# Patient Record
Sex: Male | Born: 1945
Health system: Southern US, Community
[De-identification: ages and names within clinical notes are randomized; demographics above are authoritative.]

## PROBLEM LIST (undated history)

## (undated) DIAGNOSIS — I10 Essential (primary) hypertension: Secondary | ICD-10-CM

## (undated) DIAGNOSIS — Z9109 Other allergy status, other than to drugs and biological substances: Secondary | ICD-10-CM

## (undated) DIAGNOSIS — E785 Hyperlipidemia, unspecified: Secondary | ICD-10-CM

## (undated) HISTORY — DX: Other allergy status, other than to drugs and biological substances: Z91.09

## (undated) HISTORY — DX: Hyperlipidemia, unspecified: E78.5

## (undated) HISTORY — DX: Essential (primary) hypertension: I10

---

## 1998-08-08 HISTORY — PX: APPENDECTOMY: SHX54

## 2011-08-20 ENCOUNTER — Encounter: Payer: Self-pay | Admitting: Internal Medicine

## 2011-08-20 DIAGNOSIS — Z0000 Encounter for general adult medical examination without abnormal findings: Secondary | ICD-10-CM | POA: Insufficient documentation

## 2011-08-20 DIAGNOSIS — Z0001 Encounter for general adult medical examination with abnormal findings: Secondary | ICD-10-CM | POA: Insufficient documentation

## 2011-08-26 ENCOUNTER — Ambulatory Visit (INDEPENDENT_AMBULATORY_CARE_PROVIDER_SITE_OTHER)
Admission: RE | Admit: 2011-08-26 | Discharge: 2011-08-26 | Disposition: A | Discharge status: Home / Self Care | Payer: BC Managed Care – PPO | Source: Ambulatory Visit | Attending: Internal Medicine | Admitting: Internal Medicine

## 2011-08-26 ENCOUNTER — Ambulatory Visit (INDEPENDENT_AMBULATORY_CARE_PROVIDER_SITE_OTHER): Payer: BC Managed Care – PPO | Admitting: Internal Medicine

## 2011-08-26 ENCOUNTER — Other Ambulatory Visit (INDEPENDENT_AMBULATORY_CARE_PROVIDER_SITE_OTHER): Payer: BC Managed Care – PPO

## 2011-08-26 ENCOUNTER — Encounter: Payer: Self-pay | Admitting: Internal Medicine

## 2011-08-26 VITALS — BP 138/92 | HR 69 | Temp 97.5°F | Ht 60.0 in | Wt 113.0 lb

## 2011-08-26 DIAGNOSIS — R0689 Other abnormalities of breathing: Secondary | ICD-10-CM | POA: Insufficient documentation

## 2011-08-26 DIAGNOSIS — R0989 Other specified symptoms and signs involving the circulatory and respiratory systems: Secondary | ICD-10-CM

## 2011-08-26 DIAGNOSIS — Z0000 Encounter for general adult medical examination without abnormal findings: Secondary | ICD-10-CM

## 2011-08-26 DIAGNOSIS — I10 Essential (primary) hypertension: Secondary | ICD-10-CM | POA: Insufficient documentation

## 2011-08-26 DIAGNOSIS — E785 Hyperlipidemia, unspecified: Secondary | ICD-10-CM | POA: Insufficient documentation

## 2011-08-26 DIAGNOSIS — J029 Acute pharyngitis, unspecified: Secondary | ICD-10-CM | POA: Insufficient documentation

## 2011-08-26 LAB — CBC WITH DIFFERENTIAL/PLATELET
Basophils Absolute: 0.1 10*3/uL (ref 0.0–0.1)
Eosinophils Absolute: 0.3 10*3/uL (ref 0.0–0.7)
HCT: 43.3 % (ref 39.0–52.0)
Lymphs Abs: 2.4 10*3/uL (ref 0.7–4.0)
MCHC: 33.6 g/dL (ref 30.0–36.0)
MCV: 94.1 fl (ref 78.0–100.0)
Monocytes Absolute: 0.5 10*3/uL (ref 0.1–1.0)
Platelets: 223 10*3/uL (ref 150.0–400.0)
RDW: 12.8 % (ref 11.5–14.6)

## 2011-08-26 LAB — HEPATIC FUNCTION PANEL
AST: 21 U/L (ref 0–37)
Alkaline Phosphatase: 39 U/L (ref 39–117)
Total Bilirubin: 0.4 mg/dL (ref 0.3–1.2)

## 2011-08-26 LAB — LIPID PANEL
HDL: 55.2 mg/dL (ref >39.00)
VLDL: 21.4 mg/dL (ref 0.0–40.0)

## 2011-08-26 LAB — BASIC METABOLIC PANEL
Chloride: 96 mEq/L (ref 96–112)
GFR: 95.36 mL/min (ref >60.00)
Glucose, Bld: 83 mg/dL (ref 70–99)
Potassium: 3.7 mEq/L (ref 3.5–5.1)
Sodium: 135 mEq/L (ref 135–145)

## 2011-08-26 LAB — URINALYSIS, ROUTINE W REFLEX MICROSCOPIC
Hgb urine dipstick: NEGATIVE
Ketones, ur: NEGATIVE
Total Protein, Urine: NEGATIVE
Urine Glucose: NEGATIVE
Urobilinogen, UA: 0.2 (ref 0.0–1.0)

## 2011-08-26 LAB — TSH: TSH: 1.24 u[IU]/mL (ref 0.35–5.50)

## 2011-08-26 MED ORDER — AZITHROMYCIN 250 MG PO TABS: ORAL_TABLET | ORAL | Status: AC

## 2011-08-26 MED ORDER — LISINOPRIL 10 MG PO TABS: 10.0000 mg | ORAL_TABLET | Freq: Every day | ORAL | Status: DC

## 2011-08-26 MED ORDER — AMLODIPINE BESYLATE 5 MG PO TABS: 5.0000 mg | ORAL_TABLET | Freq: Every day | ORAL | Status: DC

## 2011-08-26 NOTE — Assessment & Plan Note (Signed)
mod, for antibx course,  to f/u any worsening symptoms or concerns 

## 2011-08-26 NOTE — Assessment & Plan Note (Addendum)
Mild uncontrolled, also with constipation on the verapamil;  To d/c verapamil, cont the ACE, and start amlodipine 5 qd, f/u BP at home and next visit

## 2011-08-26 NOTE — Patient Instructions (Addendum)
Take all new medications as prescribed - the antibiotic (azithromycin), and the new blood pressure medicine (amlodipine) Continue all other medications as before - the lisinopril, and the Aspirin 81 mg OK to stop the verapamil You can also take Delsym OTC for cough, and/or Mucinex (or it's generic off brand) for congestion All of your medication were sent to the pharmacy by the computer today Your EKG was good today Please go to XRAY in the Basement for the x-ray test Please go to LAB in the Basement for the blood and/or urine tests to be done today Please call if you change your mind about having the colonoscopy (colon test) Please return in 3 months, or sooner if needed; if the Blood Pressure is ok at that time, we can see you back yearly

## 2011-08-26 NOTE — Assessment & Plan Note (Signed)
Finding of dry rales I think, asympt, nonsmoker  - for cxr to r/o other

## 2011-08-26 NOTE — Assessment & Plan Note (Addendum)
Overall doing well, age appropriate education and counseling updated, referrals for preventative services and immunizations addressed, dietary and smoking counseling addressed, most recent labs and ECG reviewed.  I have personally reviewed and have noted: 1) the patient's medical and social history 2) The pt's use of alcohol, tobacco, and illicit drugs 3) The patient's current medications and supplements 4) Functional ability including ADL's, fall risk, home safety risk, hearing and visual impairment 5) Diet and physical activities 6) Evidence for depression or mood disorder 7) The patient's height, weight, and BMI have been recorded in the chart I have made referrals, and provided counseling and education based on review of the above ECG reviewed as per emr, declines colonoscopy

## 2011-08-26 NOTE — Progress Notes (Signed)
Subjective:    Patient ID: Douglas Miles, male    DOB: 1946-01-29, 66 y.o.   MRN: [id number]  HPI  Here for wellness and f/u;  Overall doing ok;  Pt denies CP, worsening SOB, DOE, wheezing, orthopnea, PND, worsening LE edema, palpitations, dizziness or syncope.  Pt denies neurological change such as new Headache, facial or extremity weakness.  Pt denies polydipsia, polyuria, or low sugar symptoms. Pt states overall good compliance with treatment and medications, good tolerability, and trying to follow lower cholesterol diet.  Pt denies worsening depressive symptoms, suicidal ideation or panic. No fever, wt loss, night sweats, loss of appetite, or other constitutional symptoms.  Pt states good ability with ADL's, low fall risk, home safety reviewed and adequate, no significant changes in hearing or vision, and occasionally active with exercise.  Unfortunately with re-start of verapamil recently per UC has had constipation., and HCTZ stopped due to low K. Also incidentally with severe ST for 2-3 days with low grade temp, general weakness and malaise Past Medical History  Diagnosis Date  . Hypertension   . Hyperlipidemia    Past Surgical History  Procedure Date  . Appendectomy 2000    reports that he has quit smoking. He does not have any smokeless tobacco history on file. He reports that he drinks alcohol. He reports that he does not use illicit drugs. family history includes Hyperlipidemia in his son and Hypertension in his other. No Known Allergies No current outpatient prescriptions on file prior to visit.   Review of Systems Review of Systems  Constitutional: Negative for diaphoresis, activity change, appetite change and unexpected weight change.  HENT: Negative for hearing loss, ear pain, facial swelling, mouth sores and neck stiffness.   Eyes: Negative for pain, redness and visual disturbance.  Respiratory: Negative for shortness of breath and wheezing.   Cardiovascular: Negative for chest  pain and palpitations.  Gastrointestinal: Negative for diarrhea, blood in stool, abdominal distention and rectal pain.  Genitourinary: Negative for hematuria, flank pain and decreased urine volume.  Musculoskeletal: Negative for myalgias and joint swelling.  Skin: Negative for color change and wound.  Neurological: Negative for syncope and numbness.  Hematological: Negative for adenopathy.  Psychiatric/Behavioral: Negative for hallucinations, self-injury, decreased concentration and agitation.      Objective:   Physical Exam BP 138/92  Pulse 69  Temp(Src) 97.5 F (36.4 C) (Oral)  Ht 5' (1.524 m)  Wt 113 lb (51.256 kg)  BMI 22.07 kg/m2  SpO2 95% Physical Exam  VS noted, mild ill Constitutional: Pt is oriented to person, place, and time. Appears well-developed and well-nourished.  Head: Normocephalic and atraumatic.  Right Ear: External ear normal.  Left Ear: External ear normal.  Bilat tm's mild erythema.  Sinus nontender.  Pharynx severe erythema, slight exudate Nose: Nose normal.  Mouth/Throat: Oropharynx is clear and moist.  Eyes: Conjunctivae and EOM are normal. Pupils are equal, round, and reactive to light.  Neck: Normal range of motion. Neck supple. No JVD present. No tracheal deviation present.  Cardiovascular: Normal rate, regular rhythm, normal heart sounds and intact distal pulses.   Pulmonary/Chest: Effort normal and breath sounds normal. except for dry rales bibasilar Abdominal: Soft. Bowel sounds are normal. There is no tenderness.  Musculoskeletal: Normal range of motion. Exhibits no edema.  Lymphadenopathy:  Has no cervical adenopathy.  Neurological: Pt is alert and oriented to person, place, and time. Pt has normal reflexes. No cranial nerve deficit.  Skin: Skin is warm and dry. No rash noted.  Psychiatric:  Has  normal mood and affect. Behavior is normal.     Assessment & Plan:

## 2011-11-25 ENCOUNTER — Encounter: Payer: Self-pay | Admitting: Internal Medicine

## 2011-11-25 ENCOUNTER — Ambulatory Visit (INDEPENDENT_AMBULATORY_CARE_PROVIDER_SITE_OTHER): Payer: BC Managed Care – PPO | Admitting: Internal Medicine

## 2011-11-25 VITALS — BP 140/90 | HR 81 | Temp 97.5°F | Ht 60.0 in | Wt 112.1 lb

## 2011-11-25 DIAGNOSIS — R0689 Other abnormalities of breathing: Secondary | ICD-10-CM

## 2011-11-25 DIAGNOSIS — Z0000 Encounter for general adult medical examination without abnormal findings: Secondary | ICD-10-CM

## 2011-11-25 DIAGNOSIS — I10 Essential (primary) hypertension: Secondary | ICD-10-CM

## 2011-11-25 DIAGNOSIS — R0989 Other specified symptoms and signs involving the circulatory and respiratory systems: Secondary | ICD-10-CM

## 2011-11-25 DIAGNOSIS — E785 Hyperlipidemia, unspecified: Secondary | ICD-10-CM

## 2011-11-25 MED ORDER — ATORVASTATIN CALCIUM 10 MG PO TABS: 10.0000 mg | ORAL_TABLET | Freq: Every day | ORAL | Status: DC

## 2011-11-25 MED ORDER — LISINOPRIL 20 MG PO TABS: 20.0000 mg | ORAL_TABLET | Freq: Every day | ORAL | Status: DC

## 2011-11-25 NOTE — Patient Instructions (Signed)
Please increase the lisinopril to 20 mg per day Please start the generic lipitor at 10 mg per day Continue all other medications as before Please have the pharmacy call with any refills you may need. Please return in 9 mo with Lab testing done 3-5 days before

## 2011-11-26 ENCOUNTER — Encounter: Payer: Self-pay | Admitting: Internal Medicine

## 2011-11-26 NOTE — Progress Notes (Signed)
  Subjective:    Patient ID: Douglas Miles, male    DOB: 22-May-1946, 66 y.o.   MRN: [id number]  HPI  Here to f/u; overall doing ok,  Pt denies chest pain, increased sob or doe, wheezing, orthopnea, PND, increased LE swelling, palpitations, dizziness or syncope.  Pt denies new neurological symptoms such as new headache, or facial or extremity weakness or numbness   Pt denies polydipsia, polyuria,   Pt states overall good compliance with meds, trying to follow lower cholesterol diet, wt overall stable.   Pt denies fever, wt loss, night sweats, loss of appetite, or other constitutional symptoms.  Son with him interpreting today.  No acute complaints Past Medical History  Diagnosis Date  . Hypertension   . Hyperlipidemia    Past Surgical History  Procedure Date  . Appendectomy 2000    reports that he has quit smoking. He does not have any smokeless tobacco history on file. He reports that he drinks alcohol. He reports that he does not use illicit drugs. family history includes Hyperlipidemia in his son and Hypertension in his other. No Known Allergies Current Outpatient Prescriptions on File Prior to Visit  Medication Sig Dispense Refill  . amLODipine (NORVASC) 5 MG tablet Take 1 tablet (5 mg total) by mouth daily.  90 tablet  3  . aspirin 81 MG tablet Take 160 mg by mouth daily.      . Fiber CHEW Chew by mouth daily.      . Multiple Vitamins-Minerals (MULTIVITAMIN PO) Take by mouth daily.      Marland Kitchen atorvastatin (LIPITOR) 10 MG tablet Take 1 tablet (10 mg total) by mouth daily.  90 tablet  3   Review of Systems Review of Systems  Eyes: Negative for photophobia and visual disturbance.  Respiratory: Negative for choking and stridor.   Gastrointestinal: Negative for vomiting and blood in stool.  Genitourinary: Negative for hematuria and decreased urine volume.  Musculoskeletal: Negative for gait problem.  Skin: Negative for color change and wound.  Neurological: Negative for tremors and numbness.    Psychiatric/Behavioral: Negative for decreased concentration. The patient is not hyperactive.      Objective:   Physical Exam BP 140/90  Pulse 81  Temp(Src) 97.5 F (36.4 C) (Oral)  Ht 5' (1.524 m)  Wt 112 lb 1.9 oz (50.857 kg)  BMI 21.90 kg/m2  SpO2 99% Physical Exam  VS noted, not ill appearing Constitutional: Pt appears well-developed and well-nourished.  HENT: Head: Normocephalic.  Right Ear: External ear normal.  Left Ear: External ear normal.  Eyes: Conjunctivae and EOM are normal. Pupils are equal, round, and reactive to light.  Neck: Normal range of motion. Neck supple.  Cardiovascular: Normal rate and regular rhythm.   Pulmonary/Chest: Effort normal and breath sounds normal.  Neurological: Pt is alert. Motor/gait normal Skin: Skin is warm. No erythema.  Psychiatric: Pt behavior is normal. Thought content normal.     Assessment & Plan:

## 2011-11-26 NOTE — Assessment & Plan Note (Signed)
Mild uncontrolled, to start lipitor 10 qd, cont low chol diet,  to f/u any worsening symptoms or concerns

## 2011-11-26 NOTE — Assessment & Plan Note (Signed)
BP Readings from Last 3 Encounters:  11/25/11 140/90  08/26/11 138/92   stable overall by hx and exam, most recent data reviewed with pt, and pt to continue medical treatment as before except to increse the lisinopril to 20 mg daily

## 2011-11-26 NOTE — Assessment & Plan Note (Signed)
Exam cleared today, recent cxr neg and shared with pt;   to f/u any worsening symptoms or concerns

## 2012-08-28 ENCOUNTER — Other Ambulatory Visit (INDEPENDENT_AMBULATORY_CARE_PROVIDER_SITE_OTHER): Payer: BC Managed Care – PPO

## 2012-08-28 DIAGNOSIS — Z0000 Encounter for general adult medical examination without abnormal findings: Secondary | ICD-10-CM

## 2012-08-28 LAB — URINALYSIS, ROUTINE W REFLEX MICROSCOPIC
Ketones, ur: NEGATIVE
Leukocytes, UA: NEGATIVE
Nitrite: NEGATIVE
Specific Gravity, Urine: <=1.005 (ref 1.000–1.030)
pH: 6.5 (ref 5.0–8.0)

## 2012-08-28 LAB — HEPATIC FUNCTION PANEL
ALT: 18 U/L (ref 0–53)
AST: 21 U/L (ref 0–37)
Alkaline Phosphatase: 33 U/L — ABNORMAL LOW (ref 39–117)
Bilirubin, Direct: 0.1 mg/dL (ref 0.0–0.3)
Total Protein: 7.4 g/dL (ref 6.0–8.3)

## 2012-08-28 LAB — CBC WITH DIFFERENTIAL/PLATELET
Basophils Relative: 0.5 % (ref 0.0–3.0)
Eosinophils Relative: 1.5 % (ref 0.0–5.0)
Lymphocytes Relative: 23.8 % (ref 12.0–46.0)
MCV: 94.9 fl (ref 78.0–100.0)
Monocytes Relative: 6.7 % (ref 3.0–12.0)
Neutrophils Relative %: 67.5 % (ref 43.0–77.0)
RBC: 4.27 Mil/uL (ref 4.22–5.81)
WBC: 9.3 10*3/uL (ref 4.5–10.5)

## 2012-08-28 LAB — BASIC METABOLIC PANEL
BUN: 15 mg/dL (ref 6–23)
Creatinine, Ser: 0.9 mg/dL (ref 0.4–1.5)
GFR: 93.8 mL/min (ref >60.00)
Glucose, Bld: 92 mg/dL (ref 70–99)

## 2012-08-28 LAB — PSA: PSA: 1.36 ng/mL (ref 0.10–4.00)

## 2012-08-28 LAB — TSH: TSH: 1.02 u[IU]/mL (ref 0.35–5.50)

## 2012-08-28 LAB — LIPID PANEL
Cholesterol: 121 mg/dL (ref 0–200)
LDL Cholesterol: 58 mg/dL (ref 0–99)

## 2012-08-31 ENCOUNTER — Encounter: Payer: Self-pay | Admitting: Internal Medicine

## 2012-08-31 ENCOUNTER — Ambulatory Visit (INDEPENDENT_AMBULATORY_CARE_PROVIDER_SITE_OTHER): Payer: BC Managed Care – PPO | Admitting: Internal Medicine

## 2012-08-31 VITALS — BP 142/80 | HR 75 | Temp 98.1°F | Ht 60.0 in | Wt 114.2 lb

## 2012-08-31 DIAGNOSIS — I10 Essential (primary) hypertension: Secondary | ICD-10-CM

## 2012-08-31 DIAGNOSIS — Z0000 Encounter for general adult medical examination without abnormal findings: Secondary | ICD-10-CM

## 2012-08-31 DIAGNOSIS — E785 Hyperlipidemia, unspecified: Secondary | ICD-10-CM

## 2012-08-31 MED ORDER — ATORVASTATIN CALCIUM 10 MG PO TABS: 10.0000 mg | ORAL_TABLET | Freq: Every day | ORAL | Status: DC

## 2012-08-31 MED ORDER — LISINOPRIL 20 MG PO TABS: 20.0000 mg | ORAL_TABLET | Freq: Every day | ORAL | Status: DC

## 2012-08-31 NOTE — Patient Instructions (Addendum)
Please continue all other medications as before, and refills have been done if requested. Your refills were done today Please call if you change your mind about the colonoscopy. Please continue your efforts at being more active, low cholesterol diet, and weight control. You are otherwise up to date with prevention measures today. Please return in 1 year for your yearly visit, or sooner if needed, with Lab testing done 3-5 days before

## 2012-08-31 NOTE — Assessment & Plan Note (Signed)

## 2012-09-01 ENCOUNTER — Encounter: Payer: Self-pay | Admitting: Internal Medicine

## 2012-09-01 ENCOUNTER — Other Ambulatory Visit: Payer: Self-pay | Admitting: Internal Medicine

## 2012-09-01 NOTE — Assessment & Plan Note (Signed)
.  stable overall by history and exam, recent data reviewed with pt, and pt to continue medical treatment as before,  to f/u any worsening symptoms or concerns BP Readings from Last 3 Encounters:  08/31/12 142/80  11/25/11 140/90  08/26/11 138/92

## 2012-09-01 NOTE — Progress Notes (Signed)
Subjective:    Patient ID: Douglas Miles, male    DOB: 05/17/1946, 67 y.o.   MRN: [id number]  HPI  67 yo [place] male, very nice, speaks some english but here with daughter (appaers in her 69's) who translates excellently. Here for wellness and f/u;  Overall doing ok;  Pt denies CP, worsening SOB, DOE, wheezing, orthopnea, PND, worsening LE edema, palpitations, dizziness or syncope.  Pt denies neurological change such as new headache, facial or extremity weakness.  Pt denies polydipsia, polyuria, or low sugar symptoms. Pt states overall good compliance with treatment and medications, good tolerability, and has been trying to follow lower cholesterol diet.  Pt denies worsening depressive symptoms, suicidal ideation or panic. No fever, night sweats, wt loss, loss of appetite, or other constitutional symptoms.  Pt states good ability with ADL's, has low fall risk, home safety reviewed and adequate, no other significant changes in hearing or vision, and only occasionally active with exercise.  Tolerting statin well.  BP at home < 140/90 Past Medical History  Diagnosis Date  . Hypertension   . Hyperlipidemia    Past Surgical History  Procedure Date  . Appendectomy 2000    reports that he has quit smoking. He does not have any smokeless tobacco history on file. He reports that he drinks alcohol. He reports that he does not use illicit drugs. family history includes Hyperlipidemia in his son and Hypertension in his other. No Known Allergies Current Outpatient Prescriptions on File Prior to Visit  Medication Sig Dispense Refill  . aspirin 81 MG tablet Take 160 mg by mouth daily.      Marland Kitchen atorvastatin (LIPITOR) 10 MG tablet Take 1 tablet (10 mg total) by mouth daily.  90 tablet  3  . Fiber CHEW Chew by mouth daily.      . Multiple Vitamins-Minerals (MULTIVITAMIN PO) Take by mouth daily.      Marland Kitchen amLODipine (NORVASC) 5 MG tablet Take 1 tablet (5 mg total) by mouth daily.  90 tablet  3   Review of  Systems Constitutional: Negative for diaphoresis, activity change, appetite change or unexpected weight change.  HENT: Negative for hearing loss, ear pain, facial swelling, mouth sores and neck stiffness.   Eyes: Negative for pain, redness and visual disturbance.  Respiratory: Negative for shortness of breath and wheezing.   Cardiovascular: Negative for chest pain and palpitations.  Gastrointestinal: Negative for diarrhea, blood in stool, abdominal distention or other pain Genitourinary: Negative for hematuria, flank pain or change in urine volume.  Musculoskeletal: Negative for myalgias and joint swelling.  Skin: Negative for color change and wound.  Neurological: Negative for syncope and numbness. other than noted Hematological: Negative for adenopathy.  Psychiatric/Behavioral: Negative for hallucinations, self-injury, decreased concentration and agitation.      Objective:   Physical Exam BP 142/80  Pulse 75  Temp 98.1 F (36.7 C) (Oral)  Ht 5' (1.524 m)  Wt 114 lb 4 oz (51.823 kg)  BMI 22.31 kg/m2  SpO2 98% VS noted,  Constitutional: Pt is oriented to person, place, and time. Appears well-developed and well-nourished.  Head: Normocephalic and atraumatic.  Right Ear: External ear normal.  Left Ear: External ear normal.  Nose: Nose normal.  Mouth/Throat: Oropharynx is clear and moist.  Eyes: Conjunctivae and EOM are normal. Pupils are equal, round, and reactive to light.  Neck: Normal range of motion. Neck supple. No JVD present. No tracheal deviation present.  Cardiovascular: Normal rate, regular rhythm, normal heart sounds and intact distal pulses.  Pulmonary/Chest: Effort normal and breath sounds normal.  Abdominal: Soft. Bowel sounds are normal. There is no tenderness. No HSM  Musculoskeletal: Normal range of motion. Exhibits no edema.  Lymphadenopathy:  Has no cervical adenopathy.  Neurological: Pt is alert and oriented to person, place, and time. Pt has normal reflexes.  No cranial nerve deficit.  Skin: Skin is warm and dry. No rash noted.  Psychiatric:  Has  normal mood and affect. Behavior is normal.     Assessment & Plan:

## 2012-09-01 NOTE — Assessment & Plan Note (Signed)
Improved, Please continue all other medications as before

## 2013-08-30 ENCOUNTER — Other Ambulatory Visit (INDEPENDENT_AMBULATORY_CARE_PROVIDER_SITE_OTHER): Payer: BC Managed Care – PPO

## 2013-08-30 DIAGNOSIS — Z0000 Encounter for general adult medical examination without abnormal findings: Secondary | ICD-10-CM

## 2013-08-30 LAB — CBC WITH DIFFERENTIAL/PLATELET
BASOS ABS: 0 10*3/uL (ref 0.0–0.1)
Basophils Relative: 0.3 % (ref 0.0–3.0)
Eosinophils Absolute: 0.2 10*3/uL (ref 0.0–0.7)
Eosinophils Relative: 2.7 % (ref 0.0–5.0)
HEMATOCRIT: 42 % (ref 39.0–52.0)
Hemoglobin: 14.1 g/dL (ref 13.0–17.0)
LYMPHS ABS: 2.5 10*3/uL (ref 0.7–4.0)
Lymphocytes Relative: 36.6 % (ref 12.0–46.0)
MCHC: 33.5 g/dL (ref 30.0–36.0)
MCV: 93.2 fl (ref 78.0–100.0)
MONOS PCT: 9.1 % (ref 3.0–12.0)
Monocytes Absolute: 0.6 10*3/uL (ref 0.1–1.0)
NEUTROS PCT: 51.3 % (ref 43.0–77.0)
Neutro Abs: 3.6 10*3/uL (ref 1.4–7.7)
PLATELETS: 209 10*3/uL (ref 150.0–400.0)
RBC: 4.51 Mil/uL (ref 4.22–5.81)
RDW: 12.9 % (ref 11.5–14.6)
WBC: 6.9 10*3/uL (ref 4.5–10.5)

## 2013-08-30 LAB — LIPID PANEL
CHOL/HDL RATIO: 2
Cholesterol: 120 mg/dL (ref 0–200)
HDL: 51 mg/dL (ref >39.00)
LDL Cholesterol: 36 mg/dL (ref 0–99)
Triglycerides: 163 mg/dL — ABNORMAL HIGH (ref 0.0–149.0)
VLDL: 32.6 mg/dL (ref 0.0–40.0)

## 2013-08-30 LAB — BASIC METABOLIC PANEL
BUN: 16 mg/dL (ref 6–23)
CALCIUM: 9.6 mg/dL (ref 8.4–10.5)
CO2: 28 mEq/L (ref 19–32)
Chloride: 100 mEq/L (ref 96–112)
Creatinine, Ser: 1 mg/dL (ref 0.4–1.5)
GFR: 82.38 mL/min (ref >60.00)
GLUCOSE: 80 mg/dL (ref 70–99)
POTASSIUM: 4.3 meq/L (ref 3.5–5.1)
SODIUM: 135 meq/L (ref 135–145)

## 2013-08-30 LAB — HEPATIC FUNCTION PANEL
ALT: 24 U/L (ref 0–53)
AST: 23 U/L (ref 0–37)
Albumin: 4.4 g/dL (ref 3.5–5.2)
Alkaline Phosphatase: 39 U/L (ref 39–117)
BILIRUBIN TOTAL: 0.6 mg/dL (ref 0.3–1.2)
Bilirubin, Direct: 0.1 mg/dL (ref 0.0–0.3)
Total Protein: 7.3 g/dL (ref 6.0–8.3)

## 2013-08-30 LAB — URINALYSIS, ROUTINE W REFLEX MICROSCOPIC
Bilirubin Urine: NEGATIVE
Hgb urine dipstick: NEGATIVE
Ketones, ur: NEGATIVE
Leukocytes, UA: NEGATIVE
NITRITE: NEGATIVE
Specific Gravity, Urine: 1.01 (ref 1.000–1.030)
Total Protein, Urine: NEGATIVE
UROBILINOGEN UA: 0.2 (ref 0.0–1.0)
Urine Glucose: NEGATIVE
pH: 6.5 (ref 5.0–8.0)

## 2013-08-30 LAB — PSA: PSA: 1.23 ng/mL (ref 0.10–4.00)

## 2013-08-30 LAB — TSH: TSH: 1.1 u[IU]/mL (ref 0.35–5.50)

## 2013-09-06 ENCOUNTER — Encounter: Payer: Self-pay | Admitting: Internal Medicine

## 2013-09-06 ENCOUNTER — Ambulatory Visit (INDEPENDENT_AMBULATORY_CARE_PROVIDER_SITE_OTHER): Payer: BC Managed Care – PPO | Admitting: Internal Medicine

## 2013-09-06 VITALS — BP 128/80 | HR 87 | Temp 98.3°F | Wt 114.2 lb

## 2013-09-06 DIAGNOSIS — E785 Hyperlipidemia, unspecified: Secondary | ICD-10-CM

## 2013-09-06 DIAGNOSIS — I10 Essential (primary) hypertension: Secondary | ICD-10-CM

## 2013-09-06 DIAGNOSIS — Z0000 Encounter for general adult medical examination without abnormal findings: Secondary | ICD-10-CM

## 2013-09-06 DIAGNOSIS — Z23 Encounter for immunization: Secondary | ICD-10-CM

## 2013-09-06 MED ORDER — ATORVASTATIN CALCIUM 10 MG PO TABS: 10.0000 mg | ORAL_TABLET | ORAL | Status: DC

## 2013-09-06 MED ORDER — AMLODIPINE BESYLATE 5 MG PO TABS: ORAL_TABLET | ORAL | Status: DC

## 2013-09-06 NOTE — Assessment & Plan Note (Signed)
stable overall by history and exam, recent data reviewed with pt, and pt to continue medical treatment as before,  to f/u any worsening symptoms or concerns BP Readings from Last 3 Encounters:  09/06/13 128/80  08/31/12 142/80  11/25/11 140/90

## 2013-09-06 NOTE — Assessment & Plan Note (Signed)
Overcontrolled, to take the lipitor 10 qod

## 2013-09-06 NOTE — Patient Instructions (Addendum)
You had the new Prevnar pneumonia shot today  OK to take the lipitor at 10 mg every Other day Please continue all other medications as before, and refills have been done if requested. Please continue your efforts at being more active, low cholesterol diet, and weight control. Please have the pharmacy call with any other refills you may need.  Please remember to sign up for My Chart if you have not done so, as this will be important to you in the future with finding out test results, communicating by private email, and scheduling acute appointments online when needed.  Please return in 1 year for your yearly visit, or sooner if needed, with Lab testing done 3-5 days before

## 2013-09-06 NOTE — Progress Notes (Signed)
Pre-visit discussion using our clinic review tool. No additional management support is needed unless otherwise documented below in the visit note.  

## 2013-09-06 NOTE — Assessment & Plan Note (Signed)

## 2013-09-06 NOTE — Progress Notes (Signed)
Subjective:    Patient ID: Douglas Miles, male    DOB: 12-Dec-1945, 68 y.o.   MRN: [id number]  HPI  Here for wellness and f/u with son as interpretor;  Overall doing ok;  Pt denies CP, worsening SOB, DOE, wheezing, orthopnea, PND, worsening LE edema, palpitations, dizziness or syncope.  Pt denies neurological change such as new headache, facial or extremity weakness.  Pt denies polydipsia, polyuria, or low sugar symptoms. Pt states overall good compliance with treatment and medications, good tolerability, and has been trying to follow lower cholesterol diet.  Pt denies worsening depressive symptoms, suicidal ideation or panic. No fever, night sweats, wt loss, loss of appetite, or other constitutional symptoms.  Pt states good ability with ADL's, has low fall risk, home safety reviewed and adequate, no other significant changes in hearing or vision, and only occasionally active with exercise. No acute complaints Past Medical History  Diagnosis Date  . Hypertension   . Hyperlipidemia    Past Surgical History  Procedure Laterality Date  . Appendectomy  2000    reports that he has quit smoking. He does not have any smokeless tobacco history on file. He reports that he drinks alcohol. He reports that he does not use illicit drugs. family history includes Hyperlipidemia in his son; Hypertension in his other. No Known Allergies Current Outpatient Prescriptions on File Prior to Visit  Medication Sig Dispense Refill  . aspirin 81 MG tablet Take 160 mg by mouth daily.      . Fiber CHEW Chew by mouth daily.      . Multiple Vitamins-Minerals (MULTIVITAMIN PO) Take by mouth daily.       No current facility-administered medications on file prior to visit.   Review of Systems Constitutional: Negative for diaphoresis, activity change, appetite change or unexpected weight change.  HENT: Negative for hearing loss, ear pain, facial swelling, mouth sores and neck stiffness.   Eyes: Negative for pain, redness  and visual disturbance.  Respiratory: Negative for shortness of breath and wheezing.   Cardiovascular: Negative for chest pain and palpitations.  Gastrointestinal: Negative for diarrhea, blood in stool, abdominal distention or other pain Genitourinary: Negative for hematuria, flank pain or change in urine volume.  Musculoskeletal: Negative for myalgias and joint swelling.  Skin: Negative for color change and wound.  Neurological: Negative for syncope and numbness. other than noted Hematological: Negative for adenopathy.  Psychiatric/Behavioral: Negative for hallucinations, self-injury, decreased concentration and agitation.      Objective:   Physical Exam BP 128/80  Pulse 87  Temp(Src) 98.3 F (36.8 C) (Oral)  Wt 114 lb 4 oz (51.823 kg)  SpO2 95% VS noted,  Constitutional: Pt is oriented to person, place, and time. Appears well-developed and well-nourished.  Head: Normocephalic and atraumatic.  Right Ear: External ear normal.  Left Ear: External ear normal.  Nose: Nose normal.  Mouth/Throat: Oropharynx is clear and moist.  Eyes: Conjunctivae and EOM are normal. Pupils are equal, round, and reactive to light.  Neck: Normal range of motion. Neck supple. No JVD present. No tracheal deviation present.  Cardiovascular: Normal rate, regular rhythm, normal heart sounds and intact distal pulses.   Pulmonary/Chest: Effort normal and breath sounds normal.  Abdominal: Soft. Bowel sounds are normal. There is no tenderness. No HSM  Musculoskeletal: Normal range of motion. Exhibits no edema.  Lymphadenopathy:  Has no cervical adenopathy.  Neurological: Pt is alert and oriented to person, place, and time. Pt has normal reflexes. No cranial nerve deficit.  Skin: Skin  is warm and dry. No rash noted. Did have a small bruise to right forearm on asa Psychiatric:  Has  normal mood and affect. Behavior is normal.     Assessment & Plan:

## 2013-09-06 NOTE — Addendum Note (Signed)
Addended by: Sharon Seller B on: 09/06/2013 04:09 PM   Modules accepted: Orders

## 2013-09-08 ENCOUNTER — Other Ambulatory Visit: Payer: Self-pay | Admitting: Internal Medicine

## 2014-04-23 ENCOUNTER — Ambulatory Visit (INDEPENDENT_AMBULATORY_CARE_PROVIDER_SITE_OTHER): Payer: BC Managed Care – PPO | Admitting: Internal Medicine

## 2014-04-23 ENCOUNTER — Encounter: Payer: Self-pay | Admitting: Internal Medicine

## 2014-04-23 VITALS — BP 118/84 | HR 86 | Temp 98.3°F | Wt 111.2 lb

## 2014-04-23 DIAGNOSIS — R3915 Urgency of urination: Secondary | ICD-10-CM

## 2014-04-23 DIAGNOSIS — I10 Essential (primary) hypertension: Secondary | ICD-10-CM

## 2014-04-23 DIAGNOSIS — J069 Acute upper respiratory infection, unspecified: Secondary | ICD-10-CM

## 2014-04-23 DIAGNOSIS — R300 Dysuria: Secondary | ICD-10-CM

## 2014-04-23 MED ORDER — AMOXICILLIN 500 MG PO CAPS: 1000.0000 mg | ORAL_CAPSULE | Freq: Two times a day (BID) | ORAL | Status: DC

## 2014-04-23 NOTE — Patient Instructions (Signed)
Please take all new medication as prescribed - the antibiotic  Please continue all other medications as before, and refills have been done if requested.  Please have the pharmacy call with any other refills you may need.  Please keep your appointments with your specialists as you may have planned  Please go to the LAB in the Basement (turn left off the elevator) for the tests to be done tomorrow - just the urine testing

## 2014-04-23 NOTE — Progress Notes (Signed)
Pre visit review using our clinic review tool, if applicable. No additional management support is needed unless otherwise documented below in the visit note. 

## 2014-04-24 ENCOUNTER — Other Ambulatory Visit (INDEPENDENT_AMBULATORY_CARE_PROVIDER_SITE_OTHER): Payer: BC Managed Care – PPO

## 2014-04-24 DIAGNOSIS — R300 Dysuria: Secondary | ICD-10-CM

## 2014-04-24 LAB — URINALYSIS, ROUTINE W REFLEX MICROSCOPIC
Bilirubin Urine: NEGATIVE
HGB URINE DIPSTICK: NEGATIVE
Ketones, ur: NEGATIVE
Leukocytes, UA: NEGATIVE
NITRITE: NEGATIVE
SPECIFIC GRAVITY, URINE: 1.01 (ref 1.000–1.030)
Total Protein, Urine: NEGATIVE
UROBILINOGEN UA: 0.2 (ref 0.0–1.0)
Urine Glucose: NEGATIVE
pH: 6.5 (ref 5.0–8.0)

## 2014-04-26 LAB — URINE CULTURE
Colony Count: NO GROWTH
Organism ID, Bacteria: NO GROWTH

## 2014-04-27 DIAGNOSIS — R3915 Urgency of urination: Secondary | ICD-10-CM | POA: Insufficient documentation

## 2014-04-27 DIAGNOSIS — J069 Acute upper respiratory infection, unspecified: Secondary | ICD-10-CM | POA: Insufficient documentation

## 2014-04-27 NOTE — Assessment & Plan Note (Signed)
Mild to mod, for antibx course,  to f/u any worsening symptoms or concerns 

## 2014-04-27 NOTE — Assessment & Plan Note (Signed)
?   UTI, vs other suh as BPH more symptomatic recent, overall mild, for urine cx, declines flomax trial

## 2014-04-27 NOTE — Assessment & Plan Note (Signed)
stable overall by history and exam, recent data reviewed with pt, and pt to continue medical treatment as before,  to f/u any worsening symptoms or concerns BP Readings from Last 3 Encounters:  04/23/14 118/84  09/06/13 128/80  08/31/12 142/80

## 2014-04-27 NOTE — Progress Notes (Signed)
   Subjective:    Patient ID: Douglas Miles, male    DOB: 14-Sep-1945, 68 y.o.   MRN: [id number]  HPI   Here with 2-3 days acute onset fever, facial pain, pressure, headache, general weakness and malaise, and greenish d/c, with mild ST and cough, but pt denies chest pain, wheezing, increased sob or doe, orthopnea, PND, increased LE swelling, palpitations, dizziness or syncope. Also with 2-3 days mild urinary discomfort, but Denies urinary symptoms such as frequency, flank pain, hematuria or n/v, but has some mild urgency in the past few months.   Past Medical History  Diagnosis Date  . Hypertension   . Hyperlipidemia    Past Surgical History  Procedure Laterality Date  . Appendectomy  2000    reports that he has quit smoking. He does not have any smokeless tobacco history on file. He reports that he drinks alcohol. He reports that he does not use illicit drugs. family history includes Hyperlipidemia in his son; Hypertension in his other. No Known Allergies Current Outpatient Prescriptions on File Prior to Visit  Medication Sig Dispense Refill  . amLODipine (NORVASC) 5 MG tablet TAKE ONE TABLET BY MOUTH EVERY DAY  90 tablet  3  . aspirin 81 MG tablet Take 160 mg by mouth daily.      Marland Kitchen atorvastatin (LIPITOR) 10 MG tablet Take 1 tablet (10 mg total) by mouth every other day.  45 tablet  3  . Fiber CHEW Chew by mouth daily.      Marland Kitchen lisinopril (PRINIVIL,ZESTRIL) 20 MG tablet TAKE ONE TABLET BY MOUTH EVERY DAY  90 tablet  3  . Multiple Vitamins-Minerals (MULTIVITAMIN PO) Take by mouth daily.       No current facility-administered medications on file prior to visit.   Review of Systems All otherwise neg per pt     Objective:   Physical Exam BP 118/84  Pulse 86  Temp(Src) 98.3 F (36.8 C) (Oral)  Wt 111 lb 4 oz (50.463 kg)  SpO2 99% VS noted, mild ill Constitutional: Pt appears well-developed, well-nourished.  HENT: Head: NCAT.  Right Ear: External ear normal.  Left Ear: External ear  normal.  Eyes: . Pupils are equal, round, and reactive to light. Conjunctivae and EOM are normal Neck: Normal range of motion. Neck supple.  Cardiovascular: Normal rate and regular rhythm.   Pulmonary/Chest: Effort normal and breath sounds normal.  - no rales or wheezing Neurological: Pt is alert. Not confused , motor grossly intact Skin: Skin is warm. No rash Psychiatric: Pt behavior is normal. No agitation.     Assessment & Plan:

## 2014-04-30 ENCOUNTER — Telehealth: Payer: Self-pay | Admitting: Internal Medicine

## 2014-04-30 NOTE — Telephone Encounter (Signed)
Urine culture results were negative, no further tx needed for this , at this time

## 2014-04-30 NOTE — Telephone Encounter (Signed)
Calling back in regards to lab results. . . .can call [phone]

## 2014-04-30 NOTE — Telephone Encounter (Signed)
Patients son informed of results.

## 2014-05-06 ENCOUNTER — Emergency Department (HOSPITAL_COMMUNITY): Payer: BC Managed Care – PPO

## 2014-05-06 ENCOUNTER — Encounter (HOSPITAL_COMMUNITY): Payer: Self-pay | Admitting: Emergency Medicine

## 2014-05-06 ENCOUNTER — Observation Stay (HOSPITAL_COMMUNITY)
Admission: EM | Admit: 2014-05-06 | Discharge: 2014-05-08 | Disposition: A | Discharge status: Home / Self Care | Payer: BC Managed Care – PPO | Attending: General Surgery | Admitting: General Surgery

## 2014-05-06 DIAGNOSIS — S2241XA Multiple fractures of ribs, right side, initial encounter for closed fracture: Principal | ICD-10-CM | POA: Insufficient documentation

## 2014-05-06 DIAGNOSIS — Z87891 Personal history of nicotine dependence: Secondary | ICD-10-CM | POA: Diagnosis not present

## 2014-05-06 DIAGNOSIS — J90 Pleural effusion, not elsewhere classified: Secondary | ICD-10-CM | POA: Insufficient documentation

## 2014-05-06 DIAGNOSIS — M25512 Pain in left shoulder: Secondary | ICD-10-CM | POA: Diagnosis present

## 2014-05-06 DIAGNOSIS — S2220XA Unspecified fracture of sternum, initial encounter for closed fracture: Secondary | ICD-10-CM

## 2014-05-06 DIAGNOSIS — Z7982 Long term (current) use of aspirin: Secondary | ICD-10-CM | POA: Diagnosis not present

## 2014-05-06 DIAGNOSIS — S2231XA Fracture of one rib, right side, initial encounter for closed fracture: Secondary | ICD-10-CM

## 2014-05-06 DIAGNOSIS — J9811 Atelectasis: Secondary | ICD-10-CM | POA: Insufficient documentation

## 2014-05-06 DIAGNOSIS — E785 Hyperlipidemia, unspecified: Secondary | ICD-10-CM | POA: Insufficient documentation

## 2014-05-06 DIAGNOSIS — S2222XA Fracture of body of sternum, initial encounter for closed fracture: Secondary | ICD-10-CM | POA: Diagnosis not present

## 2014-05-06 DIAGNOSIS — I519 Heart disease, unspecified: Secondary | ICD-10-CM

## 2014-05-06 DIAGNOSIS — I10 Essential (primary) hypertension: Secondary | ICD-10-CM | POA: Diagnosis not present

## 2014-05-06 LAB — CBC WITH DIFFERENTIAL/PLATELET
Basophils Absolute: 0 10*3/uL (ref 0.0–0.1)
Basophils Relative: 0 % (ref 0–1)
Eosinophils Absolute: 0.3 10*3/uL (ref 0.0–0.7)
Eosinophils Relative: 3 % (ref 0–5)
HEMATOCRIT: 39.6 % (ref 39.0–52.0)
Hemoglobin: 13.1 g/dL (ref 13.0–17.0)
LYMPHS ABS: 1.6 10*3/uL (ref 0.7–4.0)
LYMPHS PCT: 17 % (ref 12–46)
MCH: 30.3 pg (ref 26.0–34.0)
MCHC: 33.1 g/dL (ref 30.0–36.0)
MCV: 91.5 fL (ref 78.0–100.0)
Monocytes Absolute: 0.6 10*3/uL (ref 0.1–1.0)
Monocytes Relative: 6 % (ref 3–12)
Neutro Abs: 7 10*3/uL (ref 1.7–7.7)
Neutrophils Relative %: 74 % (ref 43–77)
PLATELETS: 220 10*3/uL (ref 150–400)
RBC: 4.33 MIL/uL (ref 4.22–5.81)
RDW: 12.3 % (ref 11.5–15.5)
WBC: 9.4 10*3/uL (ref 4.0–10.5)

## 2014-05-06 LAB — CBC
HCT: 40 % (ref 39.0–52.0)
HEMATOCRIT: 37.3 % — AB (ref 39.0–52.0)
Hemoglobin: 13.3 g/dL (ref 13.0–17.0)
Hemoglobin: 12.6 g/dL — ABNORMAL LOW (ref 13.0–17.0)
MCH: 30.4 pg (ref 26.0–34.0)
MCH: 30.8 pg (ref 26.0–34.0)
MCHC: 33.3 g/dL (ref 30.0–36.0)
MCHC: 33.8 g/dL (ref 30.0–36.0)
MCV: 91.5 fL (ref 78.0–100.0)
MCV: 91.2 fL (ref 78.0–100.0)
Platelets: 249 10*3/uL (ref 150–400)
Platelets: 197 10*3/uL (ref 150–400)
RBC: 4.37 MIL/uL (ref 4.22–5.81)
RBC: 4.09 MIL/uL — ABNORMAL LOW (ref 4.22–5.81)
RDW: 12.4 % (ref 11.5–15.5)
RDW: 12.5 % (ref 11.5–15.5)
WBC: 10.4 10*3/uL (ref 4.0–10.5)
WBC: 9.7 10*3/uL (ref 4.0–10.5)

## 2014-05-06 LAB — APTT: aPTT: 27 seconds (ref 24–37)

## 2014-05-06 LAB — I-STAT CHEM 8, ED
BUN: 18 mg/dL (ref 6–23)
CHLORIDE: 101 meq/L (ref 96–112)
CREATININE: 0.9 mg/dL (ref 0.50–1.35)
Calcium, Ion: 1.18 mmol/L (ref 1.13–1.30)
Glucose, Bld: 128 mg/dL — ABNORMAL HIGH (ref 70–99)
HCT: 43 % (ref 39.0–52.0)
Hemoglobin: 14.6 g/dL (ref 13.0–17.0)
Potassium: 3.8 mEq/L (ref 3.7–5.3)
Sodium: 139 mEq/L (ref 137–147)
TCO2: 25 mmol/L (ref 0–100)

## 2014-05-06 LAB — GLUCOSE, CAPILLARY: Glucose-Capillary: 106 mg/dL — ABNORMAL HIGH (ref 70–99)

## 2014-05-06 LAB — MRSA PCR SCREENING: MRSA by PCR: NEGATIVE

## 2014-05-06 LAB — TYPE AND SCREEN
ABO/RH(D): A POS
Antibody Screen: NEGATIVE

## 2014-05-06 LAB — PROTIME-INR
INR: 1 (ref 0.00–1.49)
Prothrombin Time: 13.2 seconds (ref 11.6–15.2)

## 2014-05-06 LAB — ABO/RH: ABO/RH(D): A POS

## 2014-05-06 MED ORDER — POTASSIUM CHLORIDE IN NACL 20-0.45 MEQ/L-% IV SOLN
INTRAVENOUS | Status: DC
  Administered 2014-05-06: 15:00:00 via INTRAVENOUS
  Filled 2014-05-06 (×4): qty 1000

## 2014-05-06 MED ORDER — LISINOPRIL 10 MG PO TABS
10.0000 mg | ORAL_TABLET | Freq: Every day | ORAL | Status: DC
  Administered 2014-05-06: 10 mg via ORAL
  Filled 2014-05-06 (×3): qty 1

## 2014-05-06 MED ORDER — ATORVASTATIN CALCIUM 10 MG PO TABS
10.0000 mg | ORAL_TABLET | ORAL | Status: DC
  Administered 2014-05-06 – 2014-05-08 (×2): 10 mg via ORAL
  Filled 2014-05-06 (×2): qty 1

## 2014-05-06 MED ORDER — IOHEXOL 350 MG/ML SOLN
80.0000 mL | Freq: Once | INTRAVENOUS | Status: AC | PRN
  Administered 2014-05-06: 100 mL via INTRAVENOUS

## 2014-05-06 MED ORDER — PANTOPRAZOLE SODIUM 40 MG IV SOLR
40.0000 mg | Freq: Every day | INTRAVENOUS | Status: DC
  Administered 2014-05-06: 40 mg via INTRAVENOUS
  Filled 2014-05-06 (×2): qty 40

## 2014-05-06 MED ORDER — MORPHINE SULFATE 2 MG/ML IJ SOLN: 2.0000 mg | INTRAMUSCULAR | Status: DC | PRN

## 2014-05-06 MED ORDER — IOHEXOL 300 MG/ML  SOLN
75.0000 mL | Freq: Once | INTRAMUSCULAR | Status: AC | PRN
  Administered 2014-05-06: 75 mL via INTRAVENOUS

## 2014-05-06 MED ORDER — ALBUMIN HUMAN 5 % IV SOLN
INTRAVENOUS | Status: AC
  Filled 2014-05-06: qty 250

## 2014-05-06 MED ORDER — ONDANSETRON HCL 4 MG/2ML IJ SOLN: 4.0000 mg | Freq: Four times a day (QID) | INTRAMUSCULAR | Status: DC | PRN

## 2014-05-06 MED ORDER — PANTOPRAZOLE SODIUM 40 MG PO TBEC: 40.0000 mg | DELAYED_RELEASE_TABLET | Freq: Every day | ORAL | Status: DC

## 2014-05-06 MED ORDER — AMLODIPINE BESYLATE 5 MG PO TABS
5.0000 mg | ORAL_TABLET | Freq: Every day | ORAL | Status: DC
  Administered 2014-05-06: 5 mg via ORAL
  Filled 2014-05-06 (×3): qty 1

## 2014-05-06 MED ORDER — TRAMADOL HCL 50 MG PO TABS
50.0000 mg | ORAL_TABLET | Freq: Four times a day (QID) | ORAL | Status: DC | PRN
  Administered 2014-05-06 – 2014-05-07 (×3): 50 mg via ORAL
  Filled 2014-05-06 (×3): qty 1

## 2014-05-06 MED ORDER — POLYETHYLENE GLYCOL 3350 17 G PO PACK
17.0000 g | PACK | Freq: Every day | ORAL | Status: DC
  Administered 2014-05-08: 17 g via ORAL
  Filled 2014-05-06 (×3): qty 1

## 2014-05-06 MED ORDER — SODIUM CHLORIDE 0.9 % IV BOLUS (SEPSIS)
1000.0000 mL | Freq: Once | INTRAVENOUS | Status: AC
Start: 2014-05-06 — End: 2014-05-06

## 2014-05-06 MED ORDER — ONDANSETRON HCL 4 MG PO TABS: 4.0000 mg | ORAL_TABLET | Freq: Four times a day (QID) | ORAL | Status: DC | PRN

## 2014-05-06 MED ORDER — DOCUSATE SODIUM 100 MG PO CAPS
100.0000 mg | ORAL_CAPSULE | Freq: Two times a day (BID) | ORAL | Status: DC
  Administered 2014-05-06 – 2014-05-08 (×3): 100 mg via ORAL
  Filled 2014-05-06 (×6): qty 1

## 2014-05-06 NOTE — ED Notes (Signed)
Waiting on family member to come back down from upstairs with other relative who was admitted in order to go with this patient.

## 2014-05-06 NOTE — Progress Notes (Signed)
Dr. Roxy Manns notified of pt's hypotensive episodes. Described that he is no longer on his service. No advice given.

## 2014-05-06 NOTE — ED Notes (Signed)
Pt returned from [place] with this RN. Family informed and back at the bedside with pt.
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
## 2014-05-06 NOTE — Progress Notes (Signed)
Pt. Had momentary drop in blood pressure and became diaphoretic. Trauma PA notified and ordered to give albumin. Pt's blood pressure quickly increased and became more responsive. Due to the time of day I notified MD on call and he ordered to hold the albumin. Will continue to monitor closely.
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
## 2014-05-06 NOTE — ED Notes (Signed)
Patient transported to CT 

## 2014-05-06 NOTE — Progress Notes (Signed)
Echo Lab  2D Echocardiogram completed.  [place], RDCS 05/06/2014 11:36 AM

## 2014-05-07 ENCOUNTER — Observation Stay (HOSPITAL_COMMUNITY): Payer: BC Managed Care – PPO

## 2014-05-07 DIAGNOSIS — S2241XA Multiple fractures of ribs, right side, initial encounter for closed fracture: Secondary | ICD-10-CM | POA: Diagnosis not present

## 2014-05-07 DIAGNOSIS — S2220XA Unspecified fracture of sternum, initial encounter for closed fracture: Secondary | ICD-10-CM

## 2014-05-07 LAB — CBC
HCT: 36.4 % — ABNORMAL LOW (ref 39.0–52.0)
Hemoglobin: 12 g/dL — ABNORMAL LOW (ref 13.0–17.0)
MCH: 30.8 pg (ref 26.0–34.0)
MCHC: 33 g/dL (ref 30.0–36.0)
MCV: 93.6 fL (ref 78.0–100.0)
Platelets: 212 10*3/uL (ref 150–400)
RBC: 3.89 MIL/uL — AB (ref 4.22–5.81)
RDW: 12.7 % (ref 11.5–15.5)
WBC: 8.8 10*3/uL (ref 4.0–10.5)

## 2014-05-07 LAB — BASIC METABOLIC PANEL
Anion gap: 12 (ref 5–15)
BUN: 16 mg/dL (ref 6–23)
CO2: 22 meq/L (ref 19–32)
CREATININE: 0.7 mg/dL (ref 0.50–1.35)
Calcium: 8.8 mg/dL (ref 8.4–10.5)
Chloride: 101 mEq/L (ref 96–112)
GFR calc Af Amer: >90 mL/min (ref >90)
Glucose, Bld: 106 mg/dL — ABNORMAL HIGH (ref 70–99)
Potassium: 3.8 mEq/L (ref 3.7–5.3)
Sodium: 135 mEq/L — ABNORMAL LOW (ref 137–147)

## 2014-05-07 MED ORDER — ENOXAPARIN SODIUM 40 MG/0.4ML ~~LOC~~ SOLN
40.0000 mg | SUBCUTANEOUS | Status: DC
  Filled 2014-05-07 (×2): qty 0.4

## 2014-05-07 NOTE — Progress Notes (Signed)
Episodes of hypotension and bradycardia appear to be vagal induced.  Will watch in the ICU until he gets around with PT.  This patient has been seen and I agree with the findings and treatment plan.  Kathryne Eriksson. Dahlia Bailiff, MD, [place] [phone] (pager) [phone] (direct pager) Trauma Surgeon
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
## 2014-05-07 NOTE — Progress Notes (Signed)
UR completed 

## 2014-05-08 ENCOUNTER — Encounter (INDEPENDENT_AMBULATORY_CARE_PROVIDER_SITE_OTHER): Payer: Self-pay | Admitting: Orthopedic Surgery

## 2014-05-08 ENCOUNTER — Telehealth: Payer: Self-pay | Admitting: *Deleted

## 2014-05-08 ENCOUNTER — Observation Stay (HOSPITAL_COMMUNITY): Payer: BC Managed Care – PPO

## 2014-05-08 DIAGNOSIS — S2241XA Multiple fractures of ribs, right side, initial encounter for closed fracture: Secondary | ICD-10-CM | POA: Diagnosis not present

## 2014-05-08 MED ORDER — TRAMADOL HCL 50 MG PO TABS: 50.0000 mg | ORAL_TABLET | Freq: Four times a day (QID) | ORAL | Status: DC | PRN

## 2014-05-08 NOTE — Discharge Instructions (Signed)
Increase activity as pain allows.  Do not return to work for 3 weeks.
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
## 2014-05-08 NOTE — Telephone Encounter (Signed)
Tried calling pt for TCM [facility] RTC...Douglas Miles
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
## 2014-05-09 NOTE — Telephone Encounter (Signed)
Transition Care Management Follow-up Telephone Call D/C on 05/08/14  How have you been since you were released from the hospital? Called pt spoke with daughter Rebbeca Paul) due to pt not speaking good english. She state he is doing ok, dad thinks he is super man   Do you understand why you were in the hospital? YES, they understood why he was admitted  Do you understand the discharge instrcutions? YES, they understood instructions  Items Reviewed:  Medications reviewed: YES, reviewed  Allergies reviewed: {YES, no changes  Dietary changes reviewed: NO  Referrals reviewed: No referrals needed   Functional Questionnaire:   Activities of Daily Living (ADLs):   She states he is independent in the following: shower and dressing himself, feeding, walking, ect She states he doesn't require assistance   Any transportation issues/concerns?: NO  Any patient concerns? NO   Confirmed importance and date/time of follow-up visits scheduled: YES, daughter made appt for 05/21/14 advise to keep appt with Dr. Jenny Reichmann   Confirmed with patient if condition begins to worsen call PCP or go to the ER.  Patient was given the Call-a-Nurse line [phone]:  YES

## 2014-05-12 ENCOUNTER — Other Ambulatory Visit: Payer: Self-pay | Admitting: Cardiothoracic Surgery

## 2014-05-12 DIAGNOSIS — I159 Secondary hypertension, unspecified: Secondary | ICD-10-CM

## 2014-05-14 ENCOUNTER — Encounter: Payer: BC Managed Care – PPO | Admitting: Cardiothoracic Surgery

## 2014-05-14 ENCOUNTER — Ambulatory Visit (INDEPENDENT_AMBULATORY_CARE_PROVIDER_SITE_OTHER): Payer: BC Managed Care – PPO | Admitting: Cardiothoracic Surgery

## 2014-05-14 ENCOUNTER — Other Ambulatory Visit: Payer: Self-pay | Admitting: Cardiothoracic Surgery

## 2014-05-14 ENCOUNTER — Ambulatory Visit
Admission: RE | Admit: 2014-05-14 | Discharge: 2014-05-14 | Disposition: A | Discharge status: Home / Self Care | Payer: BC Managed Care – PPO | Source: Ambulatory Visit | Attending: Cardiothoracic Surgery | Admitting: Cardiothoracic Surgery

## 2014-05-14 ENCOUNTER — Encounter: Payer: Self-pay | Admitting: Cardiothoracic Surgery

## 2014-05-14 VITALS — BP 136/90 | HR 108 | Wt 111.0 lb

## 2014-05-14 DIAGNOSIS — S2220XA Unspecified fracture of sternum, initial encounter for closed fracture: Secondary | ICD-10-CM

## 2014-05-14 DIAGNOSIS — I159 Secondary hypertension, unspecified: Secondary | ICD-10-CM

## 2014-05-14 DIAGNOSIS — S2220XD Unspecified fracture of sternum, subsequent encounter for fracture with routine healing: Secondary | ICD-10-CM
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
## 2014-05-21 ENCOUNTER — Ambulatory Visit (INDEPENDENT_AMBULATORY_CARE_PROVIDER_SITE_OTHER): Payer: BC Managed Care – PPO | Admitting: Internal Medicine

## 2014-05-21 ENCOUNTER — Encounter: Payer: Self-pay | Admitting: Internal Medicine

## 2014-05-21 VITALS — BP 138/84 | HR 107 | Temp 98.4°F | Wt 110.8 lb

## 2014-05-21 DIAGNOSIS — S2220XD Unspecified fracture of sternum, subsequent encounter for fracture with routine healing: Secondary | ICD-10-CM

## 2014-05-21 DIAGNOSIS — I10 Essential (primary) hypertension: Secondary | ICD-10-CM

## 2014-05-21 NOTE — Patient Instructions (Signed)
Please continue all other medications as before, and refills have been done if requested.  Please have the pharmacy call with any other refills you may need.  Please keep your appointments with your specialists as you may have planned  You are given the work note today 

## 2014-05-21 NOTE — Progress Notes (Signed)
Subjective:    Patient ID: Douglas Miles, male    DOB: 1946/03/02, 68 y.o.   MRN: [id number]  HPI  Here to f/u with son as interpretor; overall doing ok,  Pt denies worsening chest pain but still has mild tender/soreness at left mid sternal area, has recently f/u with chest surgury, and denies increased sob or doe, wheezing, orthopnea, PND, increased LE swelling, palpitations, dizziness or syncope.  Pt denies polydipsia, polyuria.  Pt denies new neurological symptoms such as new headache, or facial or extremity weakness or numbness.   Pt states overall good compliance with meds.  No other new complaints except has some aching and popping with turning head left and right horizontally.   Needs work note to cover getting back to see Dr Lawson Fiscal Nov 4. Past Medical History  Diagnosis Date  . Hypertension   . Hyperlipidemia    Past Surgical History  Procedure Laterality Date  . Appendectomy  2000    reports that he has quit smoking. He does not have any smokeless tobacco history on file. He reports that he drinks alcohol. He reports that he does not use illicit drugs. family history includes Hyperlipidemia in his son; Hypertension in his other. No Known Allergies Current Outpatient Prescriptions on File Prior to Visit  Medication Sig Dispense Refill  . amLODipine (NORVASC) 5 MG tablet TAKE ONE TABLET BY MOUTH EVERY DAY  90 tablet  3  . aspirin 81 MG tablet Take 160 mg by mouth daily.      Marland Kitchen atorvastatin (LIPITOR) 10 MG tablet Take 1 tablet (10 mg total) by mouth every other day.  45 tablet  3  . chlorpheniramine (CHLOR-TRIMETON) 4 MG tablet Take 4 mg by mouth daily.      . Fiber CHEW Chew by mouth daily.      Marland Kitchen lisinopril (PRINIVIL,ZESTRIL) 20 MG tablet TAKE ONE TABLET BY MOUTH EVERY DAY  90 tablet  3  . traMADol (ULTRAM) 50 MG tablet Take 1-2 tablets (50-100 mg total) by mouth every 6 (six) hours as needed (50mg  for mild pain, 75mg  for moderate pain, 100mg  for severe pain).  50 tablet  0   No  current facility-administered medications on file prior to visit.   Review of Systems  Constitutional: Negative for unusual diaphoresis or other sweats  HENT: Negative for ringing in ear Eyes: Negative for double vision or worsening visual disturbance.  Respiratory: Negative for choking and stridor.   Gastrointestinal: Negative for vomiting or other signifcant bowel change Genitourinary: Negative for hematuria or decreased urine volume.  Musculoskeletal: Negative for other MSK pain or swelling Skin: Negative for color change and worsening wound.  Neurological: Negative for tremors and numbness other than noted  Psychiatric/Behavioral: Negative for decreased concentration or agitation other than above       Objective:   Physical Exam BP 138/84  Pulse 107  Temp(Src) 98.4 F (36.9 C) (Oral)  Wt 110 lb 12 oz (50.236 kg)  SpO2 97% VS noted,  Constitutional: Pt appears well-developed, well-nourished.  HENT: Head: NCAT.  Right Ear: External ear normal.  Left Ear: External ear normal.  Eyes: . Pupils are equal, round, and reactive to light. Conjunctivae and EOM are normal Neck: Normal range of motion. Neck supple.  Has tender area left mid sternal, without swelling, rash Cardiovascular: Normal rate and regular rhythm.   Pulmonary/Chest: Effort normal and breath sounds normal.  Abd:  Soft, NT, ND, + BS Neurological: Pt is alert. Not confused , motor grossly intact Skin:  Skin is warm. No rash Psychiatric: Pt behavior is normal. No agitation.     Assessment & Plan:

## 2014-05-21 NOTE — Progress Notes (Signed)
Pre visit review using our clinic review tool, if applicable. No additional management support is needed unless otherwise documented below in the visit note. 

## 2014-05-25 NOTE — Assessment & Plan Note (Signed)
Overall pain improved, to f/u with chest surgury as planned

## 2014-05-25 NOTE — Assessment & Plan Note (Signed)
stable overall by history and exam, recent data reviewed with pt, and pt to continue medical treatment as before,  to f/u any worsening symptoms or concerns BP Readings from Last 3 Encounters:  05/21/14 138/84  05/14/14 136/90  05/08/14 133/84

## 2014-06-07 DIAGNOSIS — S2220XD Unspecified fracture of sternum, subsequent encounter for fracture with routine healing: Secondary | ICD-10-CM | POA: Diagnosis not present

## 2014-06-07 DIAGNOSIS — I10 Essential (primary) hypertension: Secondary | ICD-10-CM

## 2014-06-10 ENCOUNTER — Other Ambulatory Visit: Payer: Self-pay | Admitting: Cardiothoracic Surgery

## 2014-06-10 DIAGNOSIS — S2220XD Unspecified fracture of sternum, subsequent encounter for fracture with routine healing: Secondary | ICD-10-CM

## 2014-06-11 ENCOUNTER — Encounter: Payer: Self-pay | Admitting: Cardiothoracic Surgery

## 2014-06-11 ENCOUNTER — Ambulatory Visit (INDEPENDENT_AMBULATORY_CARE_PROVIDER_SITE_OTHER): Payer: BC Managed Care – PPO | Admitting: Cardiothoracic Surgery

## 2014-06-11 ENCOUNTER — Ambulatory Visit
Admission: RE | Admit: 2014-06-11 | Discharge: 2014-06-11 | Disposition: A | Discharge status: Home / Self Care | Payer: BC Managed Care – PPO | Source: Ambulatory Visit | Attending: Cardiothoracic Surgery | Admitting: Cardiothoracic Surgery

## 2014-06-11 VITALS — BP 133/83 | HR 83 | Ht 60.0 in | Wt 110.0 lb

## 2014-06-11 DIAGNOSIS — S2220XD Unspecified fracture of sternum, subsequent encounter for fracture with routine healing: Secondary | ICD-10-CM
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
## 2014-07-09 ENCOUNTER — Other Ambulatory Visit: Payer: Self-pay | Admitting: *Deleted

## 2014-07-09 ENCOUNTER — Ambulatory Visit (INDEPENDENT_AMBULATORY_CARE_PROVIDER_SITE_OTHER): Payer: BC Managed Care – PPO | Admitting: Cardiothoracic Surgery

## 2014-07-09 ENCOUNTER — Encounter: Payer: Self-pay | Admitting: Cardiothoracic Surgery

## 2014-07-09 ENCOUNTER — Ambulatory Visit
Admission: RE | Admit: 2014-07-09 | Discharge: 2014-07-09 | Disposition: A | Discharge status: Home / Self Care | Payer: BC Managed Care – PPO | Source: Ambulatory Visit | Attending: Cardiothoracic Surgery | Admitting: Cardiothoracic Surgery

## 2014-07-09 VITALS — BP 159/95 | HR 100 | Resp 16 | Ht 62.0 in | Wt 110.0 lb

## 2014-07-09 DIAGNOSIS — S2220XD Unspecified fracture of sternum, subsequent encounter for fracture with routine healing: Secondary | ICD-10-CM
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
## 2014-09-04 ENCOUNTER — Other Ambulatory Visit (INDEPENDENT_AMBULATORY_CARE_PROVIDER_SITE_OTHER): Payer: PPO

## 2014-09-04 DIAGNOSIS — Z0000 Encounter for general adult medical examination without abnormal findings: Secondary | ICD-10-CM | POA: Diagnosis not present

## 2014-09-04 LAB — BASIC METABOLIC PANEL
BUN: 19 mg/dL (ref 6–23)
CO2: 27 mEq/L (ref 19–32)
CREATININE: 0.98 mg/dL (ref 0.40–1.50)
Calcium: 10.1 mg/dL (ref 8.4–10.5)
Chloride: 102 mEq/L (ref 96–112)
GFR: 80.2 mL/min (ref >60.00)
Glucose, Bld: 102 mg/dL — ABNORMAL HIGH (ref 70–99)
Potassium: 4.6 mEq/L (ref 3.5–5.1)
Sodium: 137 mEq/L (ref 135–145)

## 2014-09-04 LAB — CBC WITH DIFFERENTIAL/PLATELET
BASOS ABS: 0 10*3/uL (ref 0.0–0.1)
Basophils Relative: 0.5 % (ref 0.0–3.0)
EOS ABS: 0.2 10*3/uL (ref 0.0–0.7)
Eosinophils Relative: 2.7 % (ref 0.0–5.0)
HCT: 42.8 % (ref 39.0–52.0)
Hemoglobin: 14.4 g/dL (ref 13.0–17.0)
Lymphocytes Relative: 36 % (ref 12.0–46.0)
Lymphs Abs: 2.6 10*3/uL (ref 0.7–4.0)
MCHC: 33.5 g/dL (ref 30.0–36.0)
MCV: 90.6 fl (ref 78.0–100.0)
MONO ABS: 0.6 10*3/uL (ref 0.1–1.0)
Monocytes Relative: 8.8 % (ref 3.0–12.0)
NEUTROS ABS: 3.7 10*3/uL (ref 1.4–7.7)
NEUTROS PCT: 52 % (ref 43.0–77.0)
Platelets: 231 10*3/uL (ref 150.0–400.0)
RBC: 4.73 Mil/uL (ref 4.22–5.81)
RDW: 12.9 % (ref 11.5–15.5)
WBC: 7.2 10*3/uL (ref 4.0–10.5)

## 2014-09-04 LAB — LIPID PANEL
Cholesterol: 144 mg/dL (ref 0–200)
HDL: 47.8 mg/dL (ref >39.00)
LDL Cholesterol: 67 mg/dL (ref 0–99)
NonHDL: 96.2
Total CHOL/HDL Ratio: 3
Triglycerides: 144 mg/dL (ref 0.0–149.0)
VLDL: 28.8 mg/dL (ref 0.0–40.0)

## 2014-09-04 LAB — URINALYSIS, ROUTINE W REFLEX MICROSCOPIC
BILIRUBIN URINE: NEGATIVE
Hgb urine dipstick: NEGATIVE
Ketones, ur: NEGATIVE
Leukocytes, UA: NEGATIVE
Nitrite: NEGATIVE
PH: 6.5 (ref 5.0–8.0)
RBC / HPF: NONE SEEN (ref 0–?)
Specific Gravity, Urine: 1.01 (ref 1.000–1.030)
Total Protein, Urine: NEGATIVE
UROBILINOGEN UA: 0.2 (ref 0.0–1.0)
Urine Glucose: NEGATIVE
WBC, UA: NONE SEEN (ref 0–?)

## 2014-09-04 LAB — HEPATIC FUNCTION PANEL
ALK PHOS: 44 U/L (ref 39–117)
ALT: 18 U/L (ref 0–53)
AST: 19 U/L (ref 0–37)
Albumin: 4.7 g/dL (ref 3.5–5.2)
Bilirubin, Direct: 0.1 mg/dL (ref 0.0–0.3)
TOTAL PROTEIN: 7.5 g/dL (ref 6.0–8.3)
Total Bilirubin: 0.3 mg/dL (ref 0.2–1.2)

## 2014-09-04 LAB — TSH: TSH: 1.12 u[IU]/mL (ref 0.35–4.50)

## 2014-09-04 LAB — PSA: PSA: 3.15 ng/mL (ref 0.10–4.00)

## 2014-09-09 ENCOUNTER — Encounter: Payer: Self-pay | Admitting: Internal Medicine

## 2014-09-09 ENCOUNTER — Ambulatory Visit (INDEPENDENT_AMBULATORY_CARE_PROVIDER_SITE_OTHER): Payer: PPO | Admitting: Internal Medicine

## 2014-09-09 ENCOUNTER — Telehealth: Payer: Self-pay | Admitting: Internal Medicine

## 2014-09-09 VITALS — BP 122/82 | HR 87 | Temp 97.8°F | Ht 62.0 in | Wt 117.1 lb

## 2014-09-09 DIAGNOSIS — Z0000 Encounter for general adult medical examination without abnormal findings: Secondary | ICD-10-CM | POA: Diagnosis not present

## 2014-09-09 DIAGNOSIS — R972 Elevated prostate specific antigen [PSA]: Secondary | ICD-10-CM

## 2014-09-09 DIAGNOSIS — I10 Essential (primary) hypertension: Secondary | ICD-10-CM | POA: Diagnosis not present

## 2014-09-09 MED ORDER — AMLODIPINE BESYLATE 5 MG PO TABS: ORAL_TABLET | ORAL | Status: DC

## 2014-09-09 MED ORDER — ATORVASTATIN CALCIUM 10 MG PO TABS: 10.0000 mg | ORAL_TABLET | ORAL | Status: DC

## 2014-09-09 MED ORDER — LISINOPRIL 20 MG PO TABS: 20.0000 mg | ORAL_TABLET | Freq: Every day | ORAL | Status: DC

## 2014-09-09 NOTE — Assessment & Plan Note (Signed)
.  stable overall by history and exam, recent data reviewed with pt, and pt to continue medical treatment as before,  to f/u any worsening symptoms or concerns BP Readings from Last 3 Encounters:  09/09/14 122/82  07/09/14 159/95  06/11/14 133/83

## 2014-09-09 NOTE — Assessment & Plan Note (Signed)
?   prostatits related, offered 1 mo antibx and f/u psa, but pt/son request urology referral

## 2014-09-09 NOTE — Assessment & Plan Note (Signed)

## 2014-09-09 NOTE — Patient Instructions (Signed)
Please continue all other medications as before, and refills have been done if requested.  Please have the pharmacy call with any other refills you may need.  Please continue your efforts at being more active, low cholesterol diet, and weight control.  You are otherwise up to date with prevention measures today.  Please keep your appointments with your specialists as you may have planned  You will be contacted regarding the referral for: colonoscopy, and urology  Please remember to sign up for MyChart if you have not done so, as this will be important to you in the future with finding out test results, communicating by private email, and scheduling acute appointments online when needed.  Please return in 6 months, or sooner if needed

## 2014-09-09 NOTE — Telephone Encounter (Signed)
emmi mailed  °

## 2014-09-09 NOTE — Progress Notes (Signed)
Subjective:    Patient ID: Douglas Miles, male    DOB: Dec 16, 1945, 69 y.o.   MRN: [id number]  HPI  Here for wellness and f/u with son as interpretor;  Overall doing ok;  Pt denies CP, worsening SOB, DOE, wheezing, orthopnea, PND, worsening LE edema, palpitations, dizziness or syncope.  Pt denies neurological change such as new headache, facial or extremity weakness.  Pt denies polydipsia, polyuria, or low sugar symptoms. Pt states overall good compliance with treatment and medications, good tolerability, and has been trying to follow lower cholesterol diet.  Pt denies worsening depressive symptoms, suicidal ideation or panic. No fever, night sweats, wt loss, loss of appetite, or other constitutional symptoms.  Pt states good ability with ADL's, has low fall risk, home safety reviewed and adequate, no other significant changes in hearing or vision, and only occasionally active with exercise.  Denies urinary symptoms such as dysuria, frequency, urgency, flank pain, hematuria or n/v, fever, chills. No further prostatitis symptoms as per previous visit. Initialy declines colonscopy, but now wants this Past Medical History  Diagnosis Date  . Hypertension   . Hyperlipidemia    Past Surgical History  Procedure Laterality Date  . Appendectomy  2000    reports that he has quit smoking. He does not have any smokeless tobacco history on file. He reports that he drinks alcohol. He reports that he does not use illicit drugs. family history includes Hyperlipidemia in his son; Hypertension in his other. No Known Allergies Current Outpatient Prescriptions on File Prior to Visit  Medication Sig Dispense Refill  . aspirin 81 MG tablet Take 160 mg by mouth daily.    . chlorpheniramine (CHLOR-TRIMETON) 4 MG tablet Take 4 mg by mouth daily.    . Fiber CHEW Chew by mouth daily.    Marland Kitchen atorvastatin (LIPITOR) 10 MG tablet Take 1 tablet (10 mg total) by mouth every other day. 45 tablet 3  . traMADol (ULTRAM) 50 MG tablet  Take 1-2 tablets (50-100 mg total) by mouth every 6 (six) hours as needed (50mg  for mild pain, 75mg  for moderate pain, 100mg  for severe pain). (Patient not taking: Reported on 07/09/2014) 50 tablet 0   No current facility-administered medications on file prior to visit.   Review of Systems Constitutional: Negative for increased diaphoresis, other activity, appetite or other siginficant weight change  HENT: Negative for worsening hearing loss, ear pain, facial swelling, mouth sores and neck stiffness.   Eyes: Negative for other worsening pain, redness or visual disturbance.  Respiratory: Negative for shortness of breath and wheezing.   Cardiovascular: Negative for chest pain and palpitations.  Gastrointestinal: Negative for diarrhea, blood in stool, abdominal distention or other pain Genitourinary: Negative for hematuria, flank pain or change in urine volume.  Musculoskeletal: Negative for myalgias or other joint complaints.  Skin: Negative for color change and wound.  Neurological: Negative for syncope and numbness. other than noted Hematological: Negative for adenopathy. or other swelling Psychiatric/Behavioral: Negative for hallucinations, self-injury, decreased concentration or other worsening agitation.      Objective:   Physical Exam BP 122/82 mmHg  Pulse 87  Temp(Src) 97.8 F (36.6 C) (Oral)  Ht 5\' 2"  (1.575 m)  Wt 117 lb 2 oz (53.128 kg)  BMI 21.42 kg/m2  SpO2 97% VS noted,  Constitutional: Pt is oriented to person, place, and time. Appears well-developed and well-nourished.  Head: Normocephalic and atraumatic.  Right Ear: External ear normal.  Left Ear: External ear normal.  Nose: Nose normal.  Mouth/Throat: Oropharynx  is clear and moist.  Eyes: Conjunctivae and EOM are normal. Pupils are equal, round, and reactive to light.  Neck: Normal range of motion. Neck supple. No JVD present. No tracheal deviation present.  Cardiovascular: Normal rate, regular rhythm, normal heart  sounds and intact distal pulses.   Pulmonary/Chest: Effort normal and breath sounds without rales or wheezing  Abdominal: Soft. Bowel sounds are normal. NT. No HSM  Musculoskeletal: Normal range of motion. Exhibits no edema.  Lymphadenopathy:  Has no cervical adenopathy.  Neurological: Pt is alert and oriented to person, place, and time. Pt has normal reflexes. No cranial nerve deficit. Motor grossly intact Skin: Skin is warm and dry. No rash noted.  Psychiatric:  Has normal mood and affect. Behavior is normal.     Assessment & Plan:

## 2014-09-10 ENCOUNTER — Encounter: Payer: Self-pay | Admitting: Gastroenterology

## 2014-09-12 ENCOUNTER — Encounter: Payer: BC Managed Care – PPO | Admitting: Internal Medicine

## 2014-09-19 ENCOUNTER — Telehealth: Payer: Self-pay

## 2014-09-19 NOTE — Telephone Encounter (Signed)
LVM for pt to call back.   RE: Flu vaccine 2015/2016?

## 2014-10-23 ENCOUNTER — Ambulatory Visit (AMBULATORY_SURGERY_CENTER): Payer: Self-pay

## 2014-10-23 VITALS — Ht 60.0 in | Wt 117.0 lb

## 2014-10-23 DIAGNOSIS — Z1211 Encounter for screening for malignant neoplasm of colon: Secondary | ICD-10-CM

## 2014-10-23 MED ORDER — SUPREP BOWEL PREP KIT 17.5-3.13-1.6 GM/177ML PO SOLN: 1.0000 | Freq: Once | ORAL | Status: DC

## 2014-10-23 NOTE — Progress Notes (Signed)
No allergies to eggs or soy No past problems with anesthesia No diet/weight loss meds No home oxygen  No email
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
## 2014-11-06 ENCOUNTER — Ambulatory Visit (AMBULATORY_SURGERY_CENTER): Payer: PPO | Admitting: Gastroenterology

## 2014-11-06 ENCOUNTER — Encounter: Payer: Self-pay | Admitting: Gastroenterology

## 2014-11-06 VITALS — BP 124/85 | HR 90 | Temp 96.8°F | Resp 20 | Ht 61.0 in | Wt 117.0 lb

## 2014-11-06 DIAGNOSIS — D175 Benign lipomatous neoplasm of intra-abdominal organs: Secondary | ICD-10-CM

## 2014-11-06 DIAGNOSIS — Z1211 Encounter for screening for malignant neoplasm of colon: Secondary | ICD-10-CM | POA: Diagnosis not present

## 2014-11-06 MED ORDER — SODIUM CHLORIDE 0.9 % IV SOLN: 500.0000 mL | INTRAVENOUS | Status: DC

## 2014-11-06 NOTE — Patient Instructions (Signed)
YOU HAD AN ENDOSCOPIC PROCEDURE TODAY AT [facility]:   Refer to the procedure report that was given to you for any specific questions about what was found during the examination.  If the procedure report does not answer your questions, please call your gastroenterologist to clarify.  If you requested that your care partner not be given the details of your procedure findings, then the procedure report has been included in a sealed envelope for you to review at your convenience later.  YOU SHOULD EXPECT: Some feelings of bloating in the abdomen. Passage of more gas than usual.  Walking can help get rid of the air that was put into your GI tract during the procedure and reduce the bloating. If you had a lower endoscopy (such as a colonoscopy or flexible sigmoidoscopy) you may notice spotting of blood in your stool or on the toilet paper. If you underwent a bowel prep for your procedure, you may not have a normal bowel movement for a few days.  Please Note:  You might notice some irritation and congestion in your nose or some drainage.  This is from the oxygen used during your procedure.  There is no need for concern and it should clear up in a day or so.  SYMPTOMS TO REPORT IMMEDIATELY:   Following lower endoscopy (colonoscopy or flexible sigmoidoscopy):  Excessive amounts of blood in the stool  Significant tenderness or worsening of abdominal pains  Swelling of the abdomen that is new, acute  Fever of 100F or higher    For urgent or emergent issues, a gastroenterologist can be reached at any hour by calling [phone].   DIET: Your first meal following the procedure should be a small meal and then it is ok to progress to your normal diet. Heavy or fried foods are harder to digest and may make you feel nauseous or bloated.  Likewise, meals heavy in dairy and vegetables can increase bloating.  Drink plenty of fluids but you should avoid alcoholic beverages for 24  hours.  ACTIVITY:  You should plan to take it easy for the rest of today and you should NOT DRIVE or use heavy machinery until tomorrow (because of the sedation medicines used during the test).    FOLLOW UP: Our staff will call the number listed on your records the next business day following your procedure to check on you and address any questions or concerns that you may have regarding the information given to you following your procedure. If we do not reach you, we will leave a message.  However, if you are feeling well and you are not experiencing any problems, there is no need to return our call.  We will assume that you have returned to your regular daily activities without incident.  If any biopsies were taken you will be contacted by phone or by letter within the next 1-3 weeks.  Please call us at [phone] if you have not heard about the biopsies in 3 weeks.    SIGNATURES/CONFIDENTIALITY: You and/or your care partner have signed paperwork which will be entered into your electronic medical record.  These signatures attest to the fact that that the information above on your After Visit Summary has been reviewed and is understood.  Full responsibility of the confidentiality of this discharge information lies with you and/or your care-partner.   Information on hemorrhoids given to you today

## 2014-11-06 NOTE — Op Note (Signed)
[facility]  [address] [place], [id number]   COLONOSCOPY PROCEDURE REPORT  PATIENT: Douglas Miles, Douglas Miles  MR#: [id number] BIRTHDATE: [birth year] , 58  yrs. old GENDER: male ENDOSCOPIST: Inda Castle, MD REFERRED HV:FMBBU John, M.D. PROCEDURE DATE:  11/06/2014 PROCEDURE:   Colonoscopy, screening First Screening Colonoscopy - Avg.  risk and is 50 yrs.  old or older Yes.  Prior Negative Screening - Now for repeat screening. N/A  History of Adenoma - Now for follow-up colonoscopy & has been > or = to 3 yrs.  N/A ASA CLASS:   Class II INDICATIONS:Colorectal Neoplasm Risk Assessment for this procedure is average risk. MEDICATIONS: Monitored anesthesia care and Propofol 200 mg IV  DESCRIPTION OF PROCEDURE:   After the risks benefits and alternatives of the procedure were thoroughly explained, informed consent was obtained.  The digital rectal exam revealed no abnormalities of the rectum.   The LB [id number]  endoscope was introduced through the anus and advanced to the cecum, which was identified by both the appendix and ileocecal valve. No adverse events experienced.   The quality of the prep was (Suprep was used) excellent.  The instrument was then slowly withdrawn as the colon was fully examined.      COLON FINDINGS: A lipoma was found in the sigmoid colon.   Internal hemorrhoids were found.   The examination was otherwise normal. Retroflexed views revealed no abnormalities. The time to cecum = 2.1 Withdrawal time = 7.2   The scope was withdrawn and the procedure completed. COMPLICATIONS: There were no immediate complications.  ENDOSCOPIC IMPRESSION: 1.   Lipoma in the sigmoid colon 2.   Internal hemorrhoids 3.   The examination was otherwise normal  RECOMMENDATIONS: Continue current colorectal screening recommendations for "routine risk" patients with a repeat colonoscopy in 10 years.  eSigned:  Inda Castle, MD 11/06/2014 8:41  AM   cc:

## 2014-11-06 NOTE — Progress Notes (Signed)
Report to PACU, RN, vss, BBS= Clear.  

## 2014-11-07 ENCOUNTER — Telehealth: Payer: Self-pay | Admitting: *Deleted

## 2014-11-07 NOTE — Telephone Encounter (Signed)
  Follow up Call-  Call back number 11/06/2014  Post procedure Call Back phone  # [phone]- soin  Permission to leave phone message Yes    Spoke with son Patient questions:  Do you have a fever, pain , or abdominal swelling? No. Pain Score  0 *  Have you tolerated food without any problems? Yes.    Have you been able to return to your normal activities? Yes.    Do you have any questions about your discharge instructions: Diet   No. Medications  No. Follow up visit  No.  Do you have questions or concerns about your Care? No.  Actions: * If pain score is 4 or above: No action needed, pain <4.

## 2015-03-10 ENCOUNTER — Ambulatory Visit (INDEPENDENT_AMBULATORY_CARE_PROVIDER_SITE_OTHER): Payer: PPO | Admitting: Internal Medicine

## 2015-03-10 ENCOUNTER — Encounter: Payer: Self-pay | Admitting: Internal Medicine

## 2015-03-10 VITALS — BP 126/84 | HR 97 | Temp 98.0°F | Ht 61.0 in | Wt 113.0 lb

## 2015-03-10 DIAGNOSIS — I10 Essential (primary) hypertension: Secondary | ICD-10-CM

## 2015-03-10 DIAGNOSIS — R972 Elevated prostate specific antigen [PSA]: Secondary | ICD-10-CM

## 2015-03-10 DIAGNOSIS — E785 Hyperlipidemia, unspecified: Secondary | ICD-10-CM

## 2015-03-10 NOTE — Patient Instructions (Signed)
Please continue all other medications as before, and refills have been done if requested.  Please have the pharmacy call with any other refills you may need.  Please continue your efforts at being more active, low cholesterol diet, and weight control.  Please keep your appointments with your specialists as you may have planned  Please return in 6 months, or sooner if needed 

## 2015-03-10 NOTE — Progress Notes (Signed)
Pre visit review using our clinic review tool, if applicable. No additional management support is needed unless otherwise documented below in the visit note. 

## 2015-03-10 NOTE — Progress Notes (Signed)
Subjective:    Patient ID: Douglas Miles, male    DOB: 1945/11/29, 69 y.o.   MRN: [id number]  HPI  Here to f/u; overall doing ok,  Pt denies chest pain, increasing sob or doe, wheezing, orthopnea, PND, increased LE swelling, palpitations, dizziness or syncope.  Pt denies new neurological symptoms such as new headache, or facial or extremity weakness or numbness.  Pt denies polydipsia, polyuria, or low sugar episode.   Pt denies new neurological symptoms such as new headache, or facial or extremity weakness or numbness.   Pt states overall good compliance with meds, mostly trying to follow appropriate diet, with wt overall stable,  Walks 40 min per day.. BP Readings from Last 3 Encounters:  03/10/15 126/84  11/06/14 124/85  09/09/14 122/82   Wt Readings from Last 3 Encounters:  03/10/15 113 lb (51.256 kg)  11/06/14 117 lb (53.071 kg)  10/23/14 117 lb (53.071 kg)  did see urology with f/u psa decreased.  No new complaints Denies urinary symptoms such as dysuria, frequency, urgency, flank pain, hematuria or n/v, fever, chills. Past Medical History  Diagnosis Date  . Hypertension   . Hyperlipidemia   . Environmental allergies    Past Surgical History  Procedure Laterality Date  . Appendectomy  2000    reports that he has quit smoking. He has never used smokeless tobacco. He reports that he drinks alcohol. He reports that he does not use illicit drugs. family history includes Hyperlipidemia in his son; Hypertension in his other. There is no history of Colon cancer. No Known Allergies Current Outpatient Prescriptions on File Prior to Visit  Medication Sig Dispense Refill  . amLODipine (NORVASC) 5 MG tablet TAKE ONE TABLET BY MOUTH EVERY DAY 90 tablet 3  . aspirin 81 MG tablet Take 160 mg by mouth daily.    Marland Kitchen atorvastatin (LIPITOR) 10 MG tablet Take 1 tablet (10 mg total) by mouth every other day. (Patient taking differently: Take 5 mg by mouth daily. ) 45 tablet 3  . chlorpheniramine  (CHLOR-TRIMETON) 4 MG tablet Take 4 mg by mouth daily.    . Fiber CHEW Chew by mouth daily.    Marland Kitchen lisinopril (PRINIVIL,ZESTRIL) 20 MG tablet Take 1 tablet (20 mg total) by mouth daily. 90 tablet 3  . Multiple Vitamin (MULTIVITAMIN) tablet Take 1 tablet by mouth daily.     No current facility-administered medications on file prior to visit.    Review of Systems  Constitutional: Negative for unusual diaphoresis or night sweats HENT: Negative for ringing in ear or discharge Eyes: Negative for double vision or worsening visual disturbance.  Respiratory: Negative for choking and stridor.   Gastrointestinal: Negative for vomiting or other signifcant bowel change Genitourinary: Negative for hematuria or change in urine volume.  Musculoskeletal: Negative for other MSK pain or swelling Skin: Negative for color change and worsening wound.  Neurological: Negative for tremors and numbness other than noted  Psychiatric/Behavioral: Negative for decreased concentration or agitation other than above       Objective:   Physical Exam .BP 126/84 mmHg  Pulse 97  Temp(Src) 98 F (36.7 C) (Oral)  Ht 5\' 1"  (1.549 m)  Wt 113 lb (51.256 kg)  BMI 21.36 kg/m2  SpO2 97% VS noted,  Constitutional: Pt appears in no significant distress HENT: Head: NCAT.  Right Ear: External ear normal.  Left Ear: External ear normal.  Eyes: . Pupils are equal, round, and reactive to light. Conjunctivae and EOM are normal Neck: Normal range of  motion. Neck supple.  Cardiovascular: Normal rate and regular rhythm.   Pulmonary/Chest: Effort normal and breath sounds without rales or wheezing.  Abd:  Soft, NT, ND, + BS Neurological: Pt is alert. Not confused , motor grossly intact Skin: Skin is warm. No rash, no LE edema Psychiatric: Pt behavior is normal. No agitation.     Assessment & Plan:

## 2015-03-10 NOTE — Assessment & Plan Note (Signed)
stable overall by history and exam, recent data reviewed with pt, and pt to continue medical treatment as before,  to f/u any worsening symptoms or concerns Lab Results  Component Value Date   LDLCALC 67 09/04/2014

## 2015-03-10 NOTE — Assessment & Plan Note (Signed)
Improved, ok to follow, has f/u appt with urology as well

## 2015-03-10 NOTE — Assessment & Plan Note (Signed)
stable overall by history and exam, recent data reviewed with pt, and pt to continue medical treatment as before,  to f/u any worsening symptoms or concerns BP Readings from Last 3 Encounters:  03/10/15 126/84  11/06/14 124/85  09/09/14 122/82

## 2015-05-15 ENCOUNTER — Ambulatory Visit (INDEPENDENT_AMBULATORY_CARE_PROVIDER_SITE_OTHER): Payer: PPO

## 2015-05-15 DIAGNOSIS — Z23 Encounter for immunization: Secondary | ICD-10-CM | POA: Diagnosis not present

## 2015-06-25 ENCOUNTER — Telehealth: Payer: Self-pay | Admitting: Internal Medicine

## 2015-06-25 NOTE — Telephone Encounter (Signed)
Left vm to schedule AWV qwith susan hauck

## 2015-09-10 ENCOUNTER — Ambulatory Visit (INDEPENDENT_AMBULATORY_CARE_PROVIDER_SITE_OTHER): Payer: PPO | Admitting: Internal Medicine

## 2015-09-10 ENCOUNTER — Encounter: Payer: Self-pay | Admitting: Internal Medicine

## 2015-09-10 ENCOUNTER — Other Ambulatory Visit (INDEPENDENT_AMBULATORY_CARE_PROVIDER_SITE_OTHER): Payer: PPO

## 2015-09-10 VITALS — BP 130/88 | HR 78 | Temp 98.4°F | Resp 20 | Ht 61.0 in | Wt 119.0 lb

## 2015-09-10 DIAGNOSIS — Z0000 Encounter for general adult medical examination without abnormal findings: Secondary | ICD-10-CM

## 2015-09-10 DIAGNOSIS — D179 Benign lipomatous neoplasm, unspecified: Secondary | ICD-10-CM

## 2015-09-10 DIAGNOSIS — I10 Essential (primary) hypertension: Secondary | ICD-10-CM | POA: Diagnosis not present

## 2015-09-10 DIAGNOSIS — E785 Hyperlipidemia, unspecified: Secondary | ICD-10-CM

## 2015-09-10 LAB — URINALYSIS, ROUTINE W REFLEX MICROSCOPIC
Bilirubin Urine: NEGATIVE
HGB URINE DIPSTICK: NEGATIVE
Ketones, ur: NEGATIVE
LEUKOCYTES UA: NEGATIVE
NITRITE: NEGATIVE
RBC / HPF: NONE SEEN (ref 0–?)
Specific Gravity, Urine: 1.01 (ref 1.000–1.030)
Total Protein, Urine: NEGATIVE
Urine Glucose: NEGATIVE
Urobilinogen, UA: 0.2 (ref 0.0–1.0)
WBC, UA: NONE SEEN (ref 0–?)
pH: 7.5 (ref 5.0–8.0)

## 2015-09-10 LAB — BASIC METABOLIC PANEL
BUN: 15 mg/dL (ref 6–23)
CALCIUM: 10.3 mg/dL (ref 8.4–10.5)
CO2: 29 mEq/L (ref 19–32)
CREATININE: 0.87 mg/dL (ref 0.40–1.50)
Chloride: 99 mEq/L (ref 96–112)
GFR: 91.75 mL/min (ref >60.00)
Glucose, Bld: 96 mg/dL (ref 70–99)
Potassium: 4.3 mEq/L (ref 3.5–5.1)
Sodium: 136 mEq/L (ref 135–145)

## 2015-09-10 LAB — CBC WITH DIFFERENTIAL/PLATELET
BASOS ABS: 0 10*3/uL (ref 0.0–0.1)
Basophils Relative: 0.5 % (ref 0.0–3.0)
Eosinophils Absolute: 0.1 10*3/uL (ref 0.0–0.7)
Eosinophils Relative: 2 % (ref 0.0–5.0)
HCT: 45 % (ref 39.0–52.0)
Hemoglobin: 14.9 g/dL (ref 13.0–17.0)
LYMPHS ABS: 1.8 10*3/uL (ref 0.7–4.0)
Lymphocytes Relative: 30.3 % (ref 12.0–46.0)
MCHC: 33.1 g/dL (ref 30.0–36.0)
MCV: 92.5 fl (ref 78.0–100.0)
MONOS PCT: 8.1 % (ref 3.0–12.0)
Monocytes Absolute: 0.5 10*3/uL (ref 0.1–1.0)
NEUTROS PCT: 59.1 % (ref 43.0–77.0)
Neutro Abs: 3.5 10*3/uL (ref 1.4–7.7)
Platelets: 227 10*3/uL (ref 150.0–400.0)
RBC: 4.87 Mil/uL (ref 4.22–5.81)
RDW: 12.8 % (ref 11.5–15.5)
WBC: 6 10*3/uL (ref 4.0–10.5)

## 2015-09-10 LAB — HEPATIC FUNCTION PANEL
ALK PHOS: 39 U/L (ref 39–117)
ALT: 14 U/L (ref 0–53)
AST: 18 U/L (ref 0–37)
Albumin: 4.6 g/dL (ref 3.5–5.2)
BILIRUBIN TOTAL: 0.5 mg/dL (ref 0.2–1.2)
Bilirubin, Direct: 0.1 mg/dL (ref 0.0–0.3)
Total Protein: 7.9 g/dL (ref 6.0–8.3)

## 2015-09-10 LAB — LIPID PANEL
CHOL/HDL RATIO: 3
Cholesterol: 156 mg/dL (ref 0–200)
HDL: 52.4 mg/dL (ref >39.00)
LDL CALC: 67 mg/dL (ref 0–99)
NONHDL: 103.94
Triglycerides: 184 mg/dL — ABNORMAL HIGH (ref 0.0–149.0)
VLDL: 36.8 mg/dL (ref 0.0–40.0)

## 2015-09-10 LAB — HEPATITIS C ANTIBODY: HCV Ab: NEGATIVE

## 2015-09-10 LAB — PSA: PSA: 1.29 ng/mL (ref 0.10–4.00)

## 2015-09-10 LAB — TSH: TSH: 1.16 u[IU]/mL (ref 0.35–4.50)

## 2015-09-10 MED ORDER — ATORVASTATIN CALCIUM 10 MG PO TABS: 10.0000 mg | ORAL_TABLET | ORAL | Status: DC

## 2015-09-10 MED ORDER — LISINOPRIL 20 MG PO TABS: 20.0000 mg | ORAL_TABLET | Freq: Every day | ORAL | Status: DC

## 2015-09-10 MED ORDER — AMLODIPINE BESYLATE 5 MG PO TABS: ORAL_TABLET | ORAL | Status: DC

## 2015-09-10 NOTE — Progress Notes (Signed)
Pre visit review using our clinic review tool, if applicable. No additional management support is needed unless otherwise documented below in the visit note. 

## 2015-09-10 NOTE — Progress Notes (Signed)
Subjective:    Patient ID: Douglas Miles, male    DOB: Jul 31, 1946, 70 y.o.   MRN: [id number]  HPI  Here for wellness and f/u with son as interpretor;  Overall doing ok;  Pt denies Chest pain, worsening SOB, DOE, wheezing, orthopnea, PND, worsening LE edema, palpitations, dizziness or syncope.  Pt denies neurological change such as new headache, facial or extremity weakness.  Pt denies polydipsia, polyuria, or low sugar symptoms. Pt states overall good compliance with treatment and medications, good tolerability, and has been trying to follow appropriate diet.  Pt denies worsening depressive symptoms, suicidal ideation or panic. No fever, night sweats, wt loss, loss of appetite, or other constitutional symptoms.  Pt states good ability with ADL's, has low fall risk, home safety reviewed and adequate, no other significant changes in hearing or vision, and only occasionally active with exercise.  Does mention an unusual right lower buttock no painful swelling area over last 3 mo.   Past Medical History  Diagnosis Date  . Hypertension   . Hyperlipidemia   . Environmental allergies    Past Surgical History  Procedure Laterality Date  . Appendectomy  2000    reports that he has quit smoking. He has never used smokeless tobacco. He reports that he drinks alcohol. He reports that he does not use illicit drugs. family history includes Hyperlipidemia in his son; Hypertension in his other. There is no history of Colon cancer. No Known Allergies Current Outpatient Prescriptions on File Prior to Visit  Medication Sig Dispense Refill  . aspirin 81 MG tablet Take 160 mg by mouth daily.    . chlorpheniramine (CHLOR-TRIMETON) 4 MG tablet Take 4 mg by mouth daily.    . Fiber CHEW Chew by mouth daily.    . Multiple Vitamin (MULTIVITAMIN) tablet Take 1 tablet by mouth daily.     No current facility-administered medications on file prior to visit.   Review of Systems Constitutional: Negative for increased  diaphoresis, other activity, appetite or siginficant weight change other than noted HENT: Negative for worsening hearing loss, ear pain, facial swelling, mouth sores and neck stiffness.   Eyes: Negative for other worsening pain, redness or visual disturbance.  Respiratory: Negative for shortness of breath and wheezing  Cardiovascular: Negative for chest pain and palpitations.  Gastrointestinal: Negative for diarrhea, blood in stool, abdominal distention or other pain Genitourinary: Negative for hematuria, flank pain or change in urine volume.  Musculoskeletal: Negative for myalgias or other joint complaints.  Skin: Negative for color change and wound or drainage.  Neurological: Negative for syncope and numbness. other than noted Hematological: Negative for adenopathy. or other swelling Psychiatric/Behavioral: Negative for hallucinations, SI, self-injury, decreased concentration or other worsening agitation.      Objective:   Physical Exam BP 130/88 mmHg  Pulse 78  Temp(Src) 98.4 F (36.9 C) (Oral)  Resp 20  Ht 5\' 1"  (1.549 m)  Wt 119 lb (53.978 kg)  BMI 22.50 kg/m2  SpO2 95% VS noted,  Constitutional: Pt is oriented to person, place, and time. Appears well-developed and well-nourished, in no significant distress Head: Normocephalic and atraumatic.  Right Ear: External ear normal.  Left Ear: External ear normal.  Nose: Nose normal.  Mouth/Throat: Oropharynx is clear and moist.  Eyes: Conjunctivae and EOM are normal. Pupils are equal, round, and reactive to light.  Neck: Normal range of motion. Neck supple. No JVD present. No tracheal deviation present or significant neck LA or mass Cardiovascular: Normal rate, regular rhythm, normal heart  sounds and intact distal pulses.   Pulmonary/Chest: Effort normal and breath sounds without rales or wheezing  Abdominal: Soft. Bowel sounds are normal. NT. No HSM  Musculoskeletal: Normal range of motion. Exhibits no edema.  Lymphadenopathy:   Has no cervical adenopathy.  Neurological: Pt is alert and oriented to person, place, and time. Pt has normal reflexes. No cranial nerve deficit. Motor grossly intact Skin: Skin is warm and dry. No rash noted.  Psychiatric:  Has normal mood and affect. Behavior is normal.  Right lower buttock with 3 cm area lipoma nontender    Assessment & Plan:

## 2015-09-10 NOTE — Patient Instructions (Addendum)

## 2015-09-13 DIAGNOSIS — D179 Benign lipomatous neoplasm, unspecified: Secondary | ICD-10-CM | POA: Insufficient documentation

## 2015-09-13 NOTE — Assessment & Plan Note (Signed)
stable overall by history and exam, recent data reviewed with pt, and pt to continue medical treatment as before,  to f/u any worsening symptoms or concerns BP Readings from Last 3 Encounters:  09/10/15 130/88  03/10/15 126/84  11/06/14 124/85

## 2015-09-13 NOTE — Assessment & Plan Note (Signed)

## 2015-09-13 NOTE — Assessment & Plan Note (Signed)
Right buttock, d/w pt and son, no specific tx needed at this time

## 2015-09-13 NOTE — Assessment & Plan Note (Signed)
stable overall by history and exam, recent data reviewed with pt, and pt to continue medical treatment as before,  to f/u any worsening symptoms or concerns Lab Results  Component Value Date   LDLCALC 67 09/10/2015

## 2015-10-06 ENCOUNTER — Telehealth: Payer: Self-pay | Admitting: Internal Medicine

## 2015-10-06 NOTE — Telephone Encounter (Signed)
Daughter states that patients insurance will only cover labs once a year.  They will cover twice a year if labs are coded as a follow up.  Daughter wants to make sure that when patient comes back in for labs they will be covered.

## 2016-03-09 ENCOUNTER — Ambulatory Visit (INDEPENDENT_AMBULATORY_CARE_PROVIDER_SITE_OTHER): Payer: PPO | Admitting: Internal Medicine

## 2016-03-09 ENCOUNTER — Encounter: Payer: Self-pay | Admitting: Internal Medicine

## 2016-03-09 VITALS — BP 130/72 | HR 95 | Temp 98.2°F | Resp 20 | Wt 114.0 lb

## 2016-03-09 DIAGNOSIS — E785 Hyperlipidemia, unspecified: Secondary | ICD-10-CM | POA: Diagnosis not present

## 2016-03-09 DIAGNOSIS — R5383 Other fatigue: Secondary | ICD-10-CM

## 2016-03-09 DIAGNOSIS — M542 Cervicalgia: Secondary | ICD-10-CM

## 2016-03-09 DIAGNOSIS — N529 Male erectile dysfunction, unspecified: Secondary | ICD-10-CM | POA: Insufficient documentation

## 2016-03-09 DIAGNOSIS — R6889 Other general symptoms and signs: Secondary | ICD-10-CM

## 2016-03-09 DIAGNOSIS — Z0001 Encounter for general adult medical examination with abnormal findings: Secondary | ICD-10-CM

## 2016-03-09 DIAGNOSIS — I10 Essential (primary) hypertension: Secondary | ICD-10-CM
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
## 2016-03-09 NOTE — Patient Instructions (Signed)
Please continue all other medications as before, and refills have been done if requested.  Please have the pharmacy call with any other refills you may need.  Please continue your efforts at being more active, low cholesterol diet, and weight control.  You are otherwise up to date with prevention measures today.  Please keep your appointments with your specialists as you may have planned  Please return in 6 months, or sooner if needed, with Lab testing done 3-5 days before  

## 2016-03-09 NOTE — Progress Notes (Signed)
Pre visit review using our clinic review tool, if applicable. No additional management support is needed unless otherwise documented below in the visit note. 

## 2016-03-13 NOTE — Assessment & Plan Note (Signed)
Mild, likely spinal djd or ddd related, no radicular symptoms, for tylenol prn

## 2016-03-13 NOTE — Assessment & Plan Note (Signed)
D/w pt, ultimately declines PDE5 med,  to f/u any worsening symptoms or concerns

## 2016-03-13 NOTE — Assessment & Plan Note (Addendum)
His main c/o today, I think mostly related to geriatric declination, early mild, but for testosterone level as well given the ED Lab Results  Component Value Date   WBC 6.0 09/10/2015   HGB 14.9 09/10/2015   HCT 45.0 09/10/2015   PLT 227.0 09/10/2015   GLUCOSE 96 09/10/2015   CHOL 156 09/10/2015   TRIG 184.0 (H) 09/10/2015   HDL 52.40 09/10/2015   LDLDIRECT 122.4 08/26/2011   LDLCALC 67 09/10/2015   ALT 14 09/10/2015   AST 18 09/10/2015   NA 136 09/10/2015   K 4.3 09/10/2015   CL 99 09/10/2015   CREATININE 0.87 09/10/2015   BUN 15 09/10/2015   CO2 29 09/10/2015   TSH 1.16 09/10/2015   PSA 1.29 09/10/2015   INR 1.00 05/06/2014

## 2016-03-13 NOTE — Assessment & Plan Note (Signed)
stable overall by history and exam, recent data reviewed with pt, and pt to continue medical treatment as before,  to f/u any worsening symptoms or concerns BP Readings from Last 3 Encounters:  03/09/16 130/72  09/10/15 130/88  03/10/15 126/84

## 2016-03-13 NOTE — Assessment & Plan Note (Signed)
stable overall by history and exam, recent data reviewed with pt, and pt to continue medical treatment as before,  to f/u any worsening symptoms or concerns Lab Results  Component Value Date   LDLCALC 67 09/10/2015

## 2016-09-19 ENCOUNTER — Telehealth: Payer: Self-pay | Admitting: *Deleted

## 2016-09-19 MED ORDER — LISINOPRIL 20 MG PO TABS
20.0000 mg | ORAL_TABLET | Freq: Every day | ORAL | 0 refills | Status: DC
Start: 2016-09-19 — End: 2016-09-28

## 2016-09-19 MED ORDER — ATORVASTATIN CALCIUM 10 MG PO TABS: 10.0000 mg | ORAL_TABLET | ORAL | 0 refills | Status: DC

## 2016-09-19 MED ORDER — AMLODIPINE BESYLATE 5 MG PO TABS: ORAL_TABLET | ORAL | 0 refills | Status: DC

## 2016-09-19 NOTE — Telephone Encounter (Signed)
Son left msg on triage stating dad is currently out of meds has appt for 09/29/16 but wanting to get refills. Called son back inform sent 30 day script to [facility]...Johny Chess

## 2016-09-26 ENCOUNTER — Other Ambulatory Visit (INDEPENDENT_AMBULATORY_CARE_PROVIDER_SITE_OTHER): Payer: PPO

## 2016-09-26 DIAGNOSIS — Z0001 Encounter for general adult medical examination with abnormal findings: Secondary | ICD-10-CM | POA: Diagnosis not present

## 2016-09-26 LAB — CBC WITH DIFFERENTIAL/PLATELET
BASOS PCT: 0.9 % (ref 0.0–3.0)
Basophils Absolute: 0.1 10*3/uL (ref 0.0–0.1)
EOS PCT: 1.9 % (ref 0.0–5.0)
Eosinophils Absolute: 0.1 10*3/uL (ref 0.0–0.7)
HEMATOCRIT: 43.5 % (ref 39.0–52.0)
HEMOGLOBIN: 14.6 g/dL (ref 13.0–17.0)
LYMPHS PCT: 38.6 % (ref 12.0–46.0)
Lymphs Abs: 2.3 10*3/uL (ref 0.7–4.0)
MCHC: 33.5 g/dL (ref 30.0–36.0)
MCV: 91.8 fl (ref 78.0–100.0)
MONOS PCT: 7.5 % (ref 3.0–12.0)
Monocytes Absolute: 0.4 10*3/uL (ref 0.1–1.0)
Neutro Abs: 3 10*3/uL (ref 1.4–7.7)
Neutrophils Relative %: 51.1 % (ref 43.0–77.0)
Platelets: 279 10*3/uL (ref 150.0–400.0)
RBC: 4.74 Mil/uL (ref 4.22–5.81)
RDW: 12.3 % (ref 11.5–15.5)
WBC: 5.8 10*3/uL (ref 4.0–10.5)

## 2016-09-26 LAB — URINALYSIS, ROUTINE W REFLEX MICROSCOPIC
Bilirubin Urine: NEGATIVE
HGB URINE DIPSTICK: NEGATIVE
Ketones, ur: NEGATIVE
LEUKOCYTES UA: NEGATIVE
NITRITE: NEGATIVE
RBC / HPF: NONE SEEN (ref 0–?)
SPECIFIC GRAVITY, URINE: 1.01 (ref 1.000–1.030)
Total Protein, Urine: NEGATIVE
Urine Glucose: NEGATIVE
Urobilinogen, UA: 0.2 (ref 0.0–1.0)
WBC UA: NONE SEEN (ref 0–?)
pH: 6.5 (ref 5.0–8.0)

## 2016-09-26 LAB — BASIC METABOLIC PANEL
BUN: 21 mg/dL (ref 6–23)
CALCIUM: 9.7 mg/dL (ref 8.4–10.5)
CO2: 29 mEq/L (ref 19–32)
Chloride: 98 mEq/L (ref 96–112)
Creatinine, Ser: 0.83 mg/dL (ref 0.40–1.50)
GFR: 96.59 mL/min (ref >60.00)
Glucose, Bld: 90 mg/dL (ref 70–99)
POTASSIUM: 4.7 meq/L (ref 3.5–5.1)
Sodium: 134 mEq/L — ABNORMAL LOW (ref 135–145)

## 2016-09-26 LAB — PSA: PSA: 1.72 ng/mL (ref 0.10–4.00)

## 2016-09-26 LAB — HEPATIC FUNCTION PANEL
ALT: 30 U/L (ref 0–53)
AST: 26 U/L (ref 0–37)
Albumin: 4.6 g/dL (ref 3.5–5.2)
Alkaline Phosphatase: 41 U/L (ref 39–117)
BILIRUBIN TOTAL: 0.6 mg/dL (ref 0.2–1.2)
Bilirubin, Direct: 0.1 mg/dL (ref 0.0–0.3)
Total Protein: 7.8 g/dL (ref 6.0–8.3)

## 2016-09-26 LAB — LIPID PANEL
CHOL/HDL RATIO: 3
Cholesterol: 139 mg/dL (ref 0–200)
HDL: 40.2 mg/dL (ref >39.00)
LDL CALC: 71 mg/dL (ref 0–99)
NonHDL: 98.64
TRIGLYCERIDES: 137 mg/dL (ref 0.0–149.0)
VLDL: 27.4 mg/dL (ref 0.0–40.0)

## 2016-09-26 LAB — TSH: TSH: 0.9 u[IU]/mL (ref 0.35–4.50)

## 2016-09-28 ENCOUNTER — Ambulatory Visit (INDEPENDENT_AMBULATORY_CARE_PROVIDER_SITE_OTHER): Payer: PPO | Admitting: Internal Medicine

## 2016-09-28 ENCOUNTER — Encounter: Payer: Self-pay | Admitting: Internal Medicine

## 2016-09-28 VITALS — BP 148/98 | HR 97 | Temp 97.7°F | Ht 60.0 in | Wt 118.0 lb

## 2016-09-28 DIAGNOSIS — Z0000 Encounter for general adult medical examination without abnormal findings: Secondary | ICD-10-CM | POA: Diagnosis not present

## 2016-09-28 DIAGNOSIS — I10 Essential (primary) hypertension: Secondary | ICD-10-CM | POA: Diagnosis not present

## 2016-09-28 DIAGNOSIS — I77810 Thoracic aortic ectasia: Secondary | ICD-10-CM | POA: Diagnosis not present

## 2016-09-28 MED ORDER — ATORVASTATIN CALCIUM 10 MG PO TABS: 10.0000 mg | ORAL_TABLET | ORAL | 3 refills | Status: DC

## 2016-09-28 MED ORDER — LISINOPRIL 20 MG PO TABS: 20.0000 mg | ORAL_TABLET | Freq: Every day | ORAL | 3 refills | Status: DC

## 2016-09-28 MED ORDER — AMLODIPINE BESYLATE 5 MG PO TABS: ORAL_TABLET | ORAL | 3 refills | Status: DC

## 2016-09-28 NOTE — Progress Notes (Addendum)
Subjective:    Patient ID: Douglas Miles, male    DOB: [birth year], 71 y.o.   MRN: [id number]  HPI  Here for wellness with family (son);  Overall doing ok;  Pt denies Chest pain, worsening SOB, DOE, wheezing, orthopnea, PND, worsening LE edema, palpitations, dizziness or syncope.  Pt denies neurological change such as new headache, facial or extremity weakness.  Pt denies polydipsia, polyuria, or low sugar symptoms. Pt states overall good compliance with treatment and medications, good tolerability, and has been trying to follow appropriate diet.  Pt denies worsening depressive symptoms, suicidal ideation or panic. No fever, night sweats, wt loss, loss of appetite, or other constitutional symptoms.  Pt states good ability with ADL's, has low fall risk, home safety reviewed and adequate, no other significant changes in hearing or vision, and only occasionally active with exercise. Bp's at home usually 105-110 sbp after AM meds, not yet taken meds this am.  Did have recent viral illness with c/o some mild intermittent residual non prod cough. Past Medical History:  Diagnosis Date  . Environmental allergies   . Hyperlipidemia   . Hypertension    Past Surgical History:  Procedure Laterality Date  . APPENDECTOMY  2000    reports that he has quit smoking. He has never used smokeless tobacco. He reports that he drinks alcohol. He reports that he does not use drugs. family history includes Hyperlipidemia in his son; Hypertension in his other. No Known Allergies / Current Outpatient Prescriptions on File Prior to Visit  Medication Sig Dispense Refill  . amLODipine (NORVASC) 5 MG tablet TAKE ONE TABLET BY MOUTH EVERY DAY 30 tablet 0  . aspirin 81 MG tablet Take 160 mg by mouth daily.    Marland Kitchen atorvastatin (LIPITOR) 10 MG tablet Take 1 tablet (10 mg total) by mouth every other day. Must keep 09/29/16 appt for future refills 15 tablet 0  . chlorpheniramine (CHLOR-TRIMETON) 4 MG tablet Take 4 mg by mouth daily.     . Fiber CHEW Chew by mouth daily.    Marland Kitchen lisinopril (PRINIVIL,ZESTRIL) 20 MG tablet Take 1 tablet (20 mg total) by mouth daily. Must keep 09/29/16 appt for future refills 30 tablet 0  . Multiple Vitamin (MULTIVITAMIN) tablet Take 1 tablet by mouth daily.     No current facility-administered medications on file prior to visit.     Review of Systems Constitutional: Negative for increased diaphoresis, or other activity, appetite or siginficant weight change other than noted HENT: Negative for worsening hearing loss, ear pain, facial swelling, mouth sores and neck stiffness.   Eyes: Negative for other worsening pain, redness or visual disturbance.  Respiratory: Negative for choking or stridor Cardiovascular: Negative for other chest pain and palpitations.  Gastrointestinal: Negative for worsening diarrhea, blood in stool, or abdominal distention Genitourinary: Negative for hematuria, flank pain or change in urine volume.  Musculoskeletal: Negative for myalgias or other joint complaints.  Skin: Negative for other color change and wound or drainage.  Neurological: Negative for syncope and numbness. other than noted Hematological: Negative for adenopathy. or other swelling Psychiatric/Behavioral: Negative for hallucinations, SI, self-injury, decreased concentration or other worsening agitation.  All other system neg per pt    Objective:   Physical Exam BP (!) 148/98   Pulse 97   Temp 97.7 F (36.5 C)   Ht 5' (1.524 m)   Wt 118 lb (53.5 kg)   SpO2 99%   BMI 23.05 kg/m  VS noted, not ill appearing Constitutional: Pt is oriented  to person, place, and time. Appears well-developed and well-nourished, in no significant distress Head: Normocephalic and atraumatic  Eyes: Conjunctivae and EOM are normal. Pupils are equal, round, and reactive to light Right Ear: External ear normal.  Left Ear: External ear normal Nose: Nose normal.  Mouth/Throat: Oropharynx is clear and moist  Neck: Normal  range of motion. Neck supple. No JVD present. No tracheal deviation present or significant neck LA or mass Cardiovascular: Normal rate, regular rhythm, normal heart sounds and intact distal pulses.   Pulmonary/Chest: Effort normal and breath sounds without rales or wheezing  Abdominal: Soft. Bowel sounds are normal. NT. No HSM  Musculoskeletal: Normal range of motion. Exhibits no edema Lymphadenopathy: Has no cervical adenopathy.  Neurological: Pt is alert and oriented to person, place, and time. Pt has normal reflexes. No cranial nerve deficit. Motor grossly intact Skin: Skin is warm and dry. No rash noted or new ulcers Psychiatric:  Has normal mood and affect. Behavior is normal.  No other new exam findings  Echocardiography Patient:  Douglas Miles, Douglas Miles MR #:    [id number] Study Date: 05/06/2014 Impressions: - Limited study. LVEF 55-60%, normal wall motion, dilated aortic root up to 4.1 cm, trace central AI, trivial pericardial effusion. No clear dissection flap noted.  ECG today I have personally interpreted Sinus  Rhythm  -Left atrial enlargement.   -  Negative precordial T-waves  - non specific     Assessment & Plan:

## 2016-09-28 NOTE — Assessment & Plan Note (Signed)
Will cont current tx, overall stable

## 2016-09-28 NOTE — Patient Instructions (Addendum)
Your EKG was OK today  Please continue all other medications as before, and refills have been done if requested.  Please have the pharmacy call with any other refills you may need.  Please continue your efforts at being more active, low cholesterol diet, and weight control.  You are otherwise up to date with prevention measures today.  Please keep your appointments with your specialists as you may have planned  You will be contacted regarding the referral for:  Echocardiogram  Please return in 1 year for your yearly visit, or sooner if needed, with Lab testing done 3-5 days before

## 2016-09-28 NOTE — Assessment & Plan Note (Signed)
BP seems well controlled at home, will check echo to f/u previous mild aortic root dilation to assess for worsening

## 2016-09-28 NOTE — Addendum Note (Signed)
Addended by: Elta Guadeloupe on: 09/28/2016 09:18 AM   Modules accepted: Orders

## 2016-09-28 NOTE — Assessment & Plan Note (Signed)

## 2016-10-07 ENCOUNTER — Telehealth: Payer: Self-pay | Admitting: Internal Medicine

## 2016-10-07 NOTE — Telephone Encounter (Signed)
Patient son called in. I spent 20 mintutes on the phone with him trying to inform him that the ekg was ok but Dr. Jenny Reichmann wanted him to have a echocardogram. He just did not seem to understand. He began to say that the nurse did not let the Dr. Primitivo Gauze over it and on and on.. This is a very confusing issues. I am going to talk to the nurse who did the test.

## 2016-10-17 ENCOUNTER — Other Ambulatory Visit (HOSPITAL_COMMUNITY): Payer: PPO

## 2016-10-31 ENCOUNTER — Other Ambulatory Visit: Payer: Self-pay

## 2016-10-31 ENCOUNTER — Ambulatory Visit (HOSPITAL_COMMUNITY): Payer: PPO | Attending: Cardiology

## 2016-10-31 DIAGNOSIS — E785 Hyperlipidemia, unspecified: Secondary | ICD-10-CM | POA: Diagnosis not present

## 2016-10-31 DIAGNOSIS — I77810 Thoracic aortic ectasia: Secondary | ICD-10-CM | POA: Diagnosis not present

## 2016-10-31 DIAGNOSIS — I10 Essential (primary) hypertension: Secondary | ICD-10-CM | POA: Insufficient documentation

## 2016-11-01 ENCOUNTER — Encounter: Payer: Self-pay | Admitting: Internal Medicine

## 2017-09-27 ENCOUNTER — Other Ambulatory Visit (INDEPENDENT_AMBULATORY_CARE_PROVIDER_SITE_OTHER): Payer: PPO

## 2017-09-27 DIAGNOSIS — Z0000 Encounter for general adult medical examination without abnormal findings: Secondary | ICD-10-CM

## 2017-09-27 LAB — URINALYSIS, ROUTINE W REFLEX MICROSCOPIC
Bilirubin Urine: NEGATIVE
HGB URINE DIPSTICK: NEGATIVE
KETONES UR: NEGATIVE
LEUKOCYTES UA: NEGATIVE
NITRITE: NEGATIVE
RBC / HPF: NONE SEEN (ref 0–?)
Specific Gravity, Urine: 1.015 (ref 1.000–1.030)
Total Protein, Urine: NEGATIVE
URINE GLUCOSE: NEGATIVE
Urobilinogen, UA: 0.2 (ref 0.0–1.0)
WBC UA: NONE SEEN (ref 0–?)
pH: 7 (ref 5.0–8.0)

## 2017-09-27 LAB — CBC WITH DIFFERENTIAL/PLATELET
BASOS ABS: 0 10*3/uL (ref 0.0–0.1)
Basophils Relative: 0.5 % (ref 0.0–3.0)
EOS ABS: 0.3 10*3/uL (ref 0.0–0.7)
Eosinophils Relative: 4.9 % (ref 0.0–5.0)
HCT: 43.6 % (ref 39.0–52.0)
Hemoglobin: 14.6 g/dL (ref 13.0–17.0)
LYMPHS ABS: 2.1 10*3/uL (ref 0.7–4.0)
Lymphocytes Relative: 35.1 % (ref 12.0–46.0)
MCHC: 33.5 g/dL (ref 30.0–36.0)
MCV: 93.1 fl (ref 78.0–100.0)
MONO ABS: 0.5 10*3/uL (ref 0.1–1.0)
Monocytes Relative: 8.3 % (ref 3.0–12.0)
NEUTROS PCT: 51.2 % (ref 43.0–77.0)
Neutro Abs: 3.1 10*3/uL (ref 1.4–7.7)
Platelets: 213 10*3/uL (ref 150.0–400.0)
RBC: 4.69 Mil/uL (ref 4.22–5.81)
RDW: 12.8 % (ref 11.5–15.5)
WBC: 6.1 10*3/uL (ref 4.0–10.5)

## 2017-09-27 LAB — BASIC METABOLIC PANEL
BUN: 16 mg/dL (ref 6–23)
CALCIUM: 9.5 mg/dL (ref 8.4–10.5)
CO2: 29 meq/L (ref 19–32)
CREATININE: 0.95 mg/dL (ref 0.40–1.50)
Chloride: 99 mEq/L (ref 96–112)
GFR: 82.42 mL/min (ref >60.00)
GLUCOSE: 95 mg/dL (ref 70–99)
Potassium: 4.6 mEq/L (ref 3.5–5.1)
SODIUM: 134 meq/L — AB (ref 135–145)

## 2017-09-27 LAB — LIPID PANEL
CHOL/HDL RATIO: 3
Cholesterol: 141 mg/dL (ref 0–200)
HDL: 44.4 mg/dL (ref >39.00)
LDL Cholesterol: 77 mg/dL (ref 0–99)
NONHDL: 96.55
Triglycerides: 100 mg/dL (ref 0.0–149.0)
VLDL: 20 mg/dL (ref 0.0–40.0)

## 2017-09-27 LAB — HEPATIC FUNCTION PANEL
ALK PHOS: 35 U/L — AB (ref 39–117)
ALT: 16 U/L (ref 0–53)
AST: 15 U/L (ref 0–37)
Albumin: 4.2 g/dL (ref 3.5–5.2)
BILIRUBIN DIRECT: 0.1 mg/dL (ref 0.0–0.3)
BILIRUBIN TOTAL: 0.5 mg/dL (ref 0.2–1.2)
Total Protein: 7.1 g/dL (ref 6.0–8.3)

## 2017-09-27 LAB — TSH: TSH: 1.13 u[IU]/mL (ref 0.35–4.50)

## 2017-09-27 LAB — PSA: PSA: 1.59 ng/mL (ref 0.10–4.00)

## 2017-10-04 ENCOUNTER — Encounter: Payer: PPO | Admitting: Internal Medicine

## 2017-10-04 ENCOUNTER — Encounter: Payer: Self-pay | Admitting: Internal Medicine

## 2017-10-04 ENCOUNTER — Ambulatory Visit (INDEPENDENT_AMBULATORY_CARE_PROVIDER_SITE_OTHER): Payer: PPO | Admitting: Internal Medicine

## 2017-10-04 VITALS — BP 112/78 | HR 95 | Temp 97.9°F | Ht 60.0 in | Wt 116.0 lb

## 2017-10-04 DIAGNOSIS — Z0000 Encounter for general adult medical examination without abnormal findings: Secondary | ICD-10-CM

## 2017-10-04 DIAGNOSIS — Z23 Encounter for immunization: Secondary | ICD-10-CM | POA: Diagnosis not present

## 2017-10-04 DIAGNOSIS — R739 Hyperglycemia, unspecified: Secondary | ICD-10-CM | POA: Diagnosis not present

## 2017-10-04 DIAGNOSIS — I10 Essential (primary) hypertension: Secondary | ICD-10-CM

## 2017-10-04 MED ORDER — LISINOPRIL 20 MG PO TABS: 20.0000 mg | ORAL_TABLET | Freq: Every day | ORAL | 3 refills | Status: DC

## 2017-10-04 MED ORDER — ATORVASTATIN CALCIUM 10 MG PO TABS: 10.0000 mg | ORAL_TABLET | ORAL | 3 refills | Status: DC

## 2017-10-04 MED ORDER — AMLODIPINE BESYLATE 5 MG PO TABS: ORAL_TABLET | ORAL | 3 refills | Status: DC

## 2017-10-04 NOTE — Progress Notes (Signed)
Subjective:    Patient ID: Douglas Miles, male    DOB: [birth year], 72 y.o.   MRN: [id number]  HPI  Here for wellness and f/u;  Overall doing ok;  Pt denies Chest pain, worsening SOB, DOE, wheezing, orthopnea, PND, worsening LE edema, palpitations, dizziness or syncope.  Pt denies neurological change such as new headache, facial or extremity weakness.  Pt denies polydipsia, polyuria, or low sugar symptoms. Pt states overall good compliance with treatment and medications, good tolerability, and has been trying to follow appropriate diet.  Pt denies worsening depressive symptoms, suicidal ideation or panic. No fever, night sweats, wt loss, loss of appetite, or other constitutional symptoms.  Pt states good ability with ADL's, has low fall risk, home safety reviewed and adequate, no other significant changes in hearing or vision, and very active with vigorous daily exercise./  No new complaints or interval hx. Past Medical History:  Diagnosis Date  . Environmental allergies   . Hyperlipidemia   . Hypertension    Past Surgical History:  Procedure Laterality Date  . APPENDECTOMY  2000    reports that he has quit smoking. he has never used smokeless tobacco. He reports that he drinks alcohol. He reports that he does not use drugs. family history includes Hyperlipidemia in his son; Hypertension in his other. No Known Allergies Current Outpatient Medications on File Prior to Visit  Medication Sig Dispense Refill  . aspirin 81 MG tablet Take 160 mg by mouth daily.    . Fiber CHEW Chew by mouth daily.    . Multiple Vitamin (MULTIVITAMIN) tablet Take 1 tablet by mouth daily.     No current facility-administered medications on file prior to visit.    Review of Systems Constitutional: Negative for other unusual diaphoresis, sweats, appetite or weight changes HENT: Negative for other worsening hearing loss, ear pain, facial swelling, mouth sores or neck stiffness.   Eyes: Negative for other worsening  pain, redness or other visual disturbance.  Respiratory: Negative for other stridor or swelling Cardiovascular: Negative for other palpitations or other chest pain  Gastrointestinal: Negative for worsening diarrhea or loose stools, blood in stool, distention or other pain Genitourinary: Negative for hematuria, flank pain or other change in urine volume.  Musculoskeletal: Negative for myalgias or other joint swelling.  Skin: Negative for other color change, or other wound or worsening drainage.  Neurological: Negative for other syncope or numbness. Hematological: Negative for other adenopathy or swelling Psychiatric/Behavioral: Negative for hallucinations, other worsening agitation, SI, self-injury, or new decreased concentration All other system neg per pt    Objective:   Physical Exam BP 112/78   Pulse 95   Temp 97.9 F (36.6 C) (Oral)   Ht 5' (1.524 m)   Wt 116 lb (52.6 kg)   SpO2 98%   BMI 22.65 kg/m  VS noted,  Constitutional: Pt is oriented to person, place, and time. Appears well-developed and well-nourished, in no significant distress and comfortable Head: Normocephalic and atraumatic  Eyes: Conjunctivae and EOM are normal. Pupils are equal, round, and reactive to light Right Ear: External ear normal without discharge Left Ear: External ear normal without discharge Nose: Nose without discharge or deformity Mouth/Throat: Oropharynx is without other ulcerations and moist  Neck: Normal range of motion. Neck supple. No JVD present. No tracheal deviation present or significant neck LA or mass Cardiovascular: Normal rate, regular rhythm, normal heart sounds and intact distal pulses.   Pulmonary/Chest: WOB normal and breath sounds without rales or wheezing  Abdominal:  Soft. Bowel sounds are normal. NT. No HSM  Musculoskeletal: Normal range of motion. Exhibits no edema Lymphadenopathy: Has no other cervical adenopathy.  Neurological: Pt is alert and oriented to person, place, and  time. Pt has normal reflexes. No cranial nerve deficit. Motor grossly intact, Gait intact Skin: Skin is warm and dry. No rash noted or new ulcerations Psychiatric:  Has normal mood and affect. Behavior is normal without agitation No other exam findings Lab Results  Component Value Date   WBC 6.1 09/27/2017   HGB 14.6 09/27/2017   HCT 43.6 09/27/2017   PLT 213.0 09/27/2017   GLUCOSE 95 09/27/2017   CHOL 141 09/27/2017   TRIG 100.0 09/27/2017   HDL 44.40 09/27/2017   LDLDIRECT 122.4 08/26/2011   LDLCALC 77 09/27/2017   ALT 16 09/27/2017   AST 15 09/27/2017   NA 134 (L) 09/27/2017   K 4.6 09/27/2017   CL 99 09/27/2017   CREATININE 0.95 09/27/2017   BUN 16 09/27/2017   CO2 29 09/27/2017   TSH 1.13 09/27/2017   PSA 1.59 09/27/2017   INR 1.00 05/06/2014      Assessment & Plan:

## 2017-10-04 NOTE — Patient Instructions (Addendum)
You had the Pneumovax shot today to help prevent pneumonia  Please continue all other medications as before, and refills have been done if requested.  Please have the pharmacy call with any other refills you may need.  Please continue your efforts at being more active, low cholesterol diet, and weight control.  You are otherwise up to date with prevention measures today.  Please keep your appointments with your specialists as you may have planned  Please return in 1 year for your yearly visit, or sooner if needed, with Lab testing done 3-5 days before

## 2017-10-07 ENCOUNTER — Encounter: Payer: Self-pay | Admitting: Internal Medicine

## 2017-10-07 NOTE — Assessment & Plan Note (Signed)
Minor, for a1c with labs 

## 2017-10-07 NOTE — Assessment & Plan Note (Signed)

## 2017-10-07 NOTE — Assessment & Plan Note (Signed)
stable overall by history and exam, recent data reviewed with pt, and pt to continue medical treatment as before,  to f/u any worsening symptoms or concerns BP Readings from Last 3 Encounters:  10/04/17 112/78  09/28/16 (!) 148/98  03/09/16 130/72

## 2018-09-25 ENCOUNTER — Other Ambulatory Visit: Payer: Self-pay | Admitting: Internal Medicine

## 2018-09-25 MED ORDER — LISINOPRIL 20 MG PO TABS: 20.0000 mg | ORAL_TABLET | Freq: Every day | ORAL | 0 refills | Status: DC

## 2018-09-25 MED ORDER — ATORVASTATIN CALCIUM 10 MG PO TABS: 10.0000 mg | ORAL_TABLET | ORAL | 0 refills | Status: DC

## 2018-09-25 MED ORDER — AMLODIPINE BESYLATE 5 MG PO TABS: ORAL_TABLET | ORAL | 0 refills | Status: DC

## 2018-09-25 NOTE — Telephone Encounter (Signed)
Copied from [place] [phone]. Topic: Quick Communication - Rx Refill/Question >> Sep 25, 2018  8:36 AM Bea Graff, NT wrote: Medication: amLODipine (NORVASC) 5 MG tablet, atorvastatin (LIPITOR) 10 MG tablet, and lisinopril (PRINIVIL,ZESTRIL) 20 MG tablet  Has the patient contacted their pharmacy? Yes.   (Agent: If no, request that the patient contact the pharmacy for the refill.) (Agent: If yes, when and what did the pharmacy advise?)  Preferred Pharmacy (with phone number or street name): [facility], [place]. [phone] (Phone) [phone] (Fax)    Agent: Please be advised that RX refills may take up to 3 business days. We ask that you follow-up with your pharmacy.

## 2018-09-25 NOTE — Telephone Encounter (Signed)
Requested Prescriptions  Pending Prescriptions Disp Refills  . amLODipine (NORVASC) 5 MG tablet 30 tablet 0    Sig: TAKE ONE TABLET BY MOUTH EVERY DAY     Cardiovascular:  Calcium Channel Blockers Failed - 09/25/2018 10:04 AM      Failed - Valid encounter within last 6 months    Recent Outpatient Visits          11 months ago Preventative health care   [facility] -Georges Mouse, MD   1 year ago Preventative health care   [facility] -Georges Mouse, MD   2 years ago Essential hypertension   [place], James W, MD   3 years ago Preventative health care   [facility] -Georges Mouse, MD   3 years ago Hyperlipidemia   [place], [place]      Future Appointments            In 2 weeks Biagio Borg, MD [place], [place] BP in normal range    BP Readings from Last 1 Encounters:  10/04/17 112/78       . atorvastatin (LIPITOR) 10 MG tablet 90 tablet 3    Sig: Take 1 tablet (10 mg total) by mouth every other day. Must keep 09/29/16 appt for future refills     Cardiovascular:  Antilipid - Statins Failed - 09/25/2018 10:04 AM      Failed - Total Cholesterol in normal range and within 360 days    Cholesterol  Date Value Ref Range Status  09/27/2017 141 0 - 200 mg/dL Final    Comment:    ATP III Classification       Desirable:  < 200 mg/dL               Borderline High:  200 - 239 mg/dL          High:  > = 240 mg/dL         Failed - LDL in normal range and within 360 days    LDL Cholesterol  Date Value Ref Range Status  09/27/2017 77 0 - 99 mg/dL Final         Failed - HDL in normal range and within 360 days    HDL  Date Value Ref Range Status  09/27/2017 44.40 >39.00 mg/dL Final         Failed - Triglycerides in normal range and within 360 days    Triglycerides  Date Value Ref  Range Status  09/27/2017 100.0 0.0 - 149.0 mg/dL Final    Comment:    Normal:  <150 mg/dLBorderline High:  150 - 199 mg/dL         Failed - Valid encounter within last 12 months    Recent Outpatient Visits          11 months ago Preventative health care   [facility] -Georges Mouse, MD   1 year ago Preventative health care   [facility] -Georges Mouse, MD   2 years ago Essential hypertension   [facility] -Georges Mouse, MD   3 years ago Preventative health care   [place], [place]   3 years ago Hyperlipidemia   [facility] -Georges Mouse, MD      Future Appointments            In 2 weeks Jenny Reichmann Hunt Oris, MD [place], [place] - Patient is not pregnant    . lisinopril (PRINIVIL,ZESTRIL) 20 MG tablet 90 tablet 3    Sig: Take 1 tablet (20 mg total) by mouth daily. Must keep 09/29/16 appt for future refills     Cardiovascular:  ACE Inhibitors Failed - 09/25/2018 10:04 AM      Failed - Cr in normal range and within 180 days    Creatinine, Ser  Date Value Ref Range Status  09/27/2017 0.95 0.40 - 1.50 mg/dL Final         Failed - K in normal range and within 180 days    Potassium  Date Value Ref Range Status  09/27/2017 4.6 3.5 - 5.1 mEq/L Final         Failed - Valid encounter within last 6 months    Recent Outpatient Visits          11 months ago Preventative health care   [facility] -Georges Mouse, MD   1 year ago Preventative health care   [facility] -Georges Mouse, MD   2 years ago Essential hypertension   [place], James W, MD   3 years ago Preventative health care   [facility] -Georges Mouse, MD   3 years ago Hyperlipidemia   [place], James  W, MD      Future Appointments            In 2 weeks Jenny Reichmann Hunt Oris, MD [place], [place] - Patient is not pregnant      Passed - Last BP in normal range    BP Readings from Last 1 Encounters:  10/04/17 112/78

## 2018-10-03 ENCOUNTER — Other Ambulatory Visit (INDEPENDENT_AMBULATORY_CARE_PROVIDER_SITE_OTHER): Payer: PPO

## 2018-10-03 DIAGNOSIS — R739 Hyperglycemia, unspecified: Secondary | ICD-10-CM

## 2018-10-03 DIAGNOSIS — Z0000 Encounter for general adult medical examination without abnormal findings: Secondary | ICD-10-CM

## 2018-10-03 LAB — LIPID PANEL
CHOL/HDL RATIO: 3
Cholesterol: 163 mg/dL (ref 0–200)
HDL: 53.7 mg/dL (ref >39.00)
LDL Cholesterol: 88 mg/dL (ref 0–99)
NonHDL: 109.2
Triglycerides: 108 mg/dL (ref 0.0–149.0)
VLDL: 21.6 mg/dL (ref 0.0–40.0)

## 2018-10-03 LAB — URINALYSIS, ROUTINE W REFLEX MICROSCOPIC
Bilirubin Urine: NEGATIVE
Hgb urine dipstick: NEGATIVE
Ketones, ur: NEGATIVE
Leukocytes,Ua: NEGATIVE
Nitrite: NEGATIVE
Specific Gravity, Urine: 1.02 (ref 1.000–1.030)
Total Protein, Urine: NEGATIVE
Urine Glucose: NEGATIVE
Urobilinogen, UA: 0.2 (ref 0.0–1.0)
pH: 6.5 (ref 5.0–8.0)

## 2018-10-03 LAB — CBC WITH DIFFERENTIAL/PLATELET
Basophils Absolute: 0 10*3/uL (ref 0.0–0.1)
Basophils Relative: 0.5 % (ref 0.0–3.0)
Eosinophils Absolute: 0.1 10*3/uL (ref 0.0–0.7)
Eosinophils Relative: 2.5 % (ref 0.0–5.0)
HCT: 44.1 % (ref 39.0–52.0)
Hemoglobin: 14.9 g/dL (ref 13.0–17.0)
LYMPHS ABS: 1.9 10*3/uL (ref 0.7–4.0)
Lymphocytes Relative: 33 % (ref 12.0–46.0)
MCHC: 33.7 g/dL (ref 30.0–36.0)
MCV: 92.6 fl (ref 78.0–100.0)
MONOS PCT: 7.6 % (ref 3.0–12.0)
Monocytes Absolute: 0.4 10*3/uL (ref 0.1–1.0)
Neutro Abs: 3.2 10*3/uL (ref 1.4–7.7)
Neutrophils Relative %: 56.4 % (ref 43.0–77.0)
Platelets: 220 10*3/uL (ref 150.0–400.0)
RBC: 4.76 Mil/uL (ref 4.22–5.81)
RDW: 12.4 % (ref 11.5–15.5)
WBC: 5.6 10*3/uL (ref 4.0–10.5)

## 2018-10-03 LAB — HEPATIC FUNCTION PANEL
ALT: 18 U/L (ref 0–53)
AST: 20 U/L (ref 0–37)
Albumin: 4.5 g/dL (ref 3.5–5.2)
Alkaline Phosphatase: 38 U/L — ABNORMAL LOW (ref 39–117)
Bilirubin, Direct: 0.1 mg/dL (ref 0.0–0.3)
Total Bilirubin: 0.6 mg/dL (ref 0.2–1.2)
Total Protein: 7.5 g/dL (ref 6.0–8.3)

## 2018-10-03 LAB — BASIC METABOLIC PANEL
BUN: 20 mg/dL (ref 6–23)
CALCIUM: 9.5 mg/dL (ref 8.4–10.5)
CO2: 28 meq/L (ref 19–32)
Chloride: 100 mEq/L (ref 96–112)
Creatinine, Ser: 0.96 mg/dL (ref 0.40–1.50)
GFR: 76.4 mL/min (ref >60.00)
Glucose, Bld: 91 mg/dL (ref 70–99)
Potassium: 4.5 mEq/L (ref 3.5–5.1)
Sodium: 135 mEq/L (ref 135–145)

## 2018-10-03 LAB — HEMOGLOBIN A1C: Hgb A1c MFr Bld: 5.7 % (ref 4.6–6.5)

## 2018-10-04 LAB — TSH: TSH: 1.44 u[IU]/mL (ref 0.35–4.50)

## 2018-10-04 LAB — PSA: PSA: 1.6 ng/mL (ref 0.10–4.00)

## 2018-10-10 ENCOUNTER — Ambulatory Visit (INDEPENDENT_AMBULATORY_CARE_PROVIDER_SITE_OTHER): Payer: PPO | Admitting: Internal Medicine

## 2018-10-10 ENCOUNTER — Encounter: Payer: Self-pay | Admitting: Internal Medicine

## 2018-10-10 DIAGNOSIS — Z0000 Encounter for general adult medical examination without abnormal findings: Secondary | ICD-10-CM

## 2018-10-10 MED ORDER — AMLODIPINE BESYLATE 5 MG PO TABS: ORAL_TABLET | ORAL | 3 refills | Status: DC

## 2018-10-10 MED ORDER — LISINOPRIL 20 MG PO TABS: 20.0000 mg | ORAL_TABLET | Freq: Every day | ORAL | 3 refills | Status: DC

## 2018-10-10 MED ORDER — ATORVASTATIN CALCIUM 10 MG PO TABS: 10.0000 mg | ORAL_TABLET | ORAL | 3 refills | Status: DC

## 2018-10-10 NOTE — Progress Notes (Signed)
Subjective:    Patient ID: Douglas Miles, male    DOB: 10/26/1945, 73 y.o.   MRN: [id number]  HPI   Here for wellness and f/u;  Overall doing ok;  Pt denies Chest pain, worsening SOB, DOE, wheezing, orthopnea, PND, worsening LE edema, palpitations, dizziness or syncope.  Pt denies neurological change such as new headache, facial or extremity weakness.  Pt denies polydipsia, polyuria, or low sugar symptoms. Pt states overall good compliance with treatment and medications, good tolerability, and has been trying to follow appropriate diet.  Pt denies worsening depressive symptoms, suicidal ideation or panic. No fever, night sweats, wt loss, loss of appetite, or other constitutional symptoms.  Pt states good ability with ADL's, has low fall risk, home safety reviewed and adequate, no other significant changes in hearing or vision, and only occasionally active with exercise. Np new complaints Past Medical History:  Diagnosis Date  . Environmental allergies   . Hyperlipidemia   . Hypertension    Past Surgical History:  Procedure Laterality Date  . APPENDECTOMY  2000    reports that he has quit smoking. He has never used smokeless tobacco. He reports current alcohol use. He reports that he does not use drugs. family history includes Hyperlipidemia in his son; Hypertension in an other family member. No Known Allergies Current Outpatient Medications on File Prior to Visit  Medication Sig Dispense Refill  . aspirin 81 MG tablet Take 160 mg by mouth daily.    . Fiber CHEW Chew by mouth daily.    . Multiple Vitamin (MULTIVITAMIN) tablet Take 1 tablet by mouth daily.     No current facility-administered medications on file prior to visit.    Review of Systems Constitutional: Negative for other unusual diaphoresis, sweats, appetite or weight changes HENT: Negative for other worsening hearing loss, ear pain, facial swelling, mouth sores or neck stiffness.   Eyes: Negative for other worsening pain,  redness or other visual disturbance.  Respiratory: Negative for other stridor or swelling Cardiovascular: Negative for other palpitations or other chest pain  Gastrointestinal: Negative for worsening diarrhea or loose stools, blood in stool, distention or other pain Genitourinary: Negative for hematuria, flank pain or other change in urine volume.  Musculoskeletal: Negative for myalgias or other joint swelling.  Skin: Negative for other color change, or other wound or worsening drainage.  Neurological: Negative for other syncope or numbness. Hematological: Negative for other adenopathy or swelling Psychiatric/Behavioral: Negative for hallucinations, other worsening agitation, SI, self-injury, or new decreased concentration All other system neg per pt    Objective:   Physical Exam BP 120/72   Pulse 83   Temp 98 F (36.7 C) (Oral)   Ht 5' (1.524 m)   Wt 115 lb (52.2 kg)   SpO2 96%   BMI 22.46 kg/m  VS noted,  Constitutional: Pt is oriented to person, place, and time. Appears well-developed and well-nourished, in no significant distress and comfortable Head: Normocephalic and atraumatic  Eyes: Conjunctivae and EOM are normal. Pupils are equal, round, and reactive to light Right Ear: External ear normal without discharge Left Ear: External ear normal without discharge Nose: Nose without discharge or deformity Mouth/Throat: Oropharynx is without other ulcerations and moist  Neck: Normal range of motion. Neck supple. No JVD present. No tracheal deviation present or significant neck LA or mass Cardiovascular: Normal rate, regular rhythm, normal heart sounds and intact distal pulses.   Pulmonary/Chest: WOB normal and breath sounds without rales or wheezing  Abdominal: Soft. Bowel sounds  are normal. NT. No HSM  Musculoskeletal: Normal range of motion. Exhibits no edema Lymphadenopathy: Has no other cervical adenopathy.  Neurological: Pt is alert and oriented to person, place, and time. Pt  has normal reflexes. No cranial nerve deficit. Motor grossly intact, Gait intact Skin: Skin is warm and dry. No rash noted or new ulcerations Psychiatric:  Has normal mood and affect. Behavior is normal without agitation No other exam findings Lab Results  Component Value Date   WBC 5.6 10/03/2018   HGB 14.9 10/03/2018   HCT 44.1 10/03/2018   PLT 220.0 10/03/2018   GLUCOSE 91 10/03/2018   CHOL 163 10/03/2018   TRIG 108.0 10/03/2018   HDL 53.70 10/03/2018   LDLDIRECT 122.4 08/26/2011   LDLCALC 88 10/03/2018   ALT 18 10/03/2018   AST 20 10/03/2018   NA 135 10/03/2018   K 4.5 10/03/2018   CL 100 10/03/2018   CREATININE 0.96 10/03/2018   BUN 20 10/03/2018   CO2 28 10/03/2018   TSH 1.44 10/03/2018   PSA 1.60 10/03/2018   INR 1.00 05/06/2014   HGBA1C 5.7 10/03/2018       Assessment & Plan:

## 2018-10-10 NOTE — Patient Instructions (Signed)
Please continue all other medications as before, and refills have been done if requested.  Please have the pharmacy call with any other refills you may need.  Please continue your efforts at being more active, low cholesterol diet, and weight control.  You are otherwise up to date with prevention measures today.  Please keep your appointments with your specialists as you may have planned  Please return in 1 year for your yearly visit, or sooner if needed, with Lab testing done 3-5 days before  

## 2018-10-10 NOTE — Assessment & Plan Note (Signed)

## 2019-10-08 ENCOUNTER — Other Ambulatory Visit: Payer: Self-pay

## 2019-10-08 ENCOUNTER — Telehealth: Payer: Self-pay

## 2019-10-08 ENCOUNTER — Other Ambulatory Visit (INDEPENDENT_AMBULATORY_CARE_PROVIDER_SITE_OTHER): Payer: PPO

## 2019-10-08 DIAGNOSIS — Z0000 Encounter for general adult medical examination without abnormal findings: Secondary | ICD-10-CM

## 2019-10-08 LAB — BASIC METABOLIC PANEL
BUN: 17 mg/dL (ref 6–23)
CO2: 28 mEq/L (ref 19–32)
Calcium: 10.1 mg/dL (ref 8.4–10.5)
Chloride: 101 mEq/L (ref 96–112)
Creatinine, Ser: 0.94 mg/dL (ref 0.40–1.50)
GFR: 78.07 mL/min (ref >60.00)
Glucose, Bld: 86 mg/dL (ref 70–99)
Potassium: 4 mEq/L (ref 3.5–5.1)
Sodium: 137 mEq/L (ref 135–145)

## 2019-10-08 LAB — URINALYSIS
Bilirubin Urine: NEGATIVE
Hgb urine dipstick: NEGATIVE
Ketones, ur: NEGATIVE
Leukocytes,Ua: NEGATIVE
Nitrite: NEGATIVE
Specific Gravity, Urine: 1.02 (ref 1.000–1.030)
Total Protein, Urine: NEGATIVE
Urine Glucose: NEGATIVE
Urobilinogen, UA: 0.2 (ref 0.0–1.0)
pH: 6.5 (ref 5.0–8.0)

## 2019-10-08 LAB — CBC WITH DIFFERENTIAL/PLATELET
Basophils Absolute: 0 10*3/uL (ref 0.0–0.1)
Basophils Relative: 0.7 % (ref 0.0–3.0)
Eosinophils Absolute: 0.1 10*3/uL (ref 0.0–0.7)
Eosinophils Relative: 2.4 % (ref 0.0–5.0)
HCT: 43 % (ref 39.0–52.0)
Hemoglobin: 14.4 g/dL (ref 13.0–17.0)
Lymphocytes Relative: 35.7 % (ref 12.0–46.0)
Lymphs Abs: 1.8 10*3/uL (ref 0.7–4.0)
MCHC: 33.5 g/dL (ref 30.0–36.0)
MCV: 93.2 fl (ref 78.0–100.0)
Monocytes Absolute: 0.4 10*3/uL (ref 0.1–1.0)
Monocytes Relative: 7 % (ref 3.0–12.0)
Neutro Abs: 2.8 10*3/uL (ref 1.4–7.7)
Neutrophils Relative %: 54.2 % (ref 43.0–77.0)
Platelets: 210 10*3/uL (ref 150.0–400.0)
RBC: 4.61 Mil/uL (ref 4.22–5.81)
RDW: 12.9 % (ref 11.5–15.5)
WBC: 5.2 10*3/uL (ref 4.0–10.5)

## 2019-10-08 LAB — PSA: PSA: 1.47 ng/mL (ref 0.10–4.00)

## 2019-10-08 LAB — HEPATIC FUNCTION PANEL
ALT: 22 U/L (ref 0–53)
AST: 17 U/L (ref 0–37)
Albumin: 4.4 g/dL (ref 3.5–5.2)
Alkaline Phosphatase: 46 U/L (ref 39–117)
Bilirubin, Direct: 0.1 mg/dL (ref 0.0–0.3)
Total Bilirubin: 0.4 mg/dL (ref 0.2–1.2)
Total Protein: 7.8 g/dL (ref 6.0–8.3)

## 2019-10-08 LAB — HEMOGLOBIN A1C: Hgb A1c MFr Bld: 5.6 % (ref 4.6–6.5)

## 2019-10-08 LAB — LIPID PANEL
Cholesterol: 151 mg/dL (ref 0–200)
HDL: 44.3 mg/dL (ref >39.00)
LDL Cholesterol: 72 mg/dL (ref 0–99)
NonHDL: 107.17
Total CHOL/HDL Ratio: 3
Triglycerides: 177 mg/dL — ABNORMAL HIGH (ref 0.0–149.0)
VLDL: 35.4 mg/dL (ref 0.0–40.0)

## 2019-10-08 LAB — TSH: TSH: 1.22 u[IU]/mL (ref 0.35–4.50)

## 2019-10-08 NOTE — Telephone Encounter (Signed)
New message    Son checking to see if lab work can be added before appt on 3.9.21

## 2019-10-08 NOTE — Telephone Encounter (Signed)
Yes, patient is currently in office [place] getting labs drawn

## 2019-10-15 ENCOUNTER — Other Ambulatory Visit: Payer: Self-pay

## 2019-10-15 ENCOUNTER — Encounter: Payer: Self-pay | Admitting: Internal Medicine

## 2019-10-15 ENCOUNTER — Ambulatory Visit (INDEPENDENT_AMBULATORY_CARE_PROVIDER_SITE_OTHER): Payer: PPO | Admitting: Internal Medicine

## 2019-10-15 VITALS — BP 142/84 | HR 107 | Temp 97.9°F | Ht 60.0 in | Wt 121.0 lb

## 2019-10-15 DIAGNOSIS — I10 Essential (primary) hypertension: Secondary | ICD-10-CM | POA: Diagnosis not present

## 2019-10-15 DIAGNOSIS — Z0000 Encounter for general adult medical examination without abnormal findings: Secondary | ICD-10-CM | POA: Diagnosis not present

## 2019-10-15 MED ORDER — LISINOPRIL 20 MG PO TABS: 20.0000 mg | ORAL_TABLET | Freq: Every day | ORAL | 3 refills | Status: DC

## 2019-10-15 MED ORDER — ATORVASTATIN CALCIUM 10 MG PO TABS: 10.0000 mg | ORAL_TABLET | ORAL | 3 refills | Status: DC

## 2019-10-15 MED ORDER — AMLODIPINE BESYLATE 10 MG PO TABS: 10.0000 mg | ORAL_TABLET | Freq: Every day | ORAL | 3 refills | Status: DC

## 2019-10-15 NOTE — Assessment & Plan Note (Signed)
Mild uncontrolled, to incresae the amldipine 10 qd

## 2019-10-15 NOTE — Progress Notes (Signed)
Subjective:    Patient ID: Douglas Miles, male    DOB: 1945/09/09, 74 y.o.   MRN: [id number]  HPI Here for wellness and f/u;  Overall doing ok;  Pt denies Chest pain, worsening SOB, DOE, wheezing, orthopnea, PND, worsening LE edema, palpitations, dizziness or syncope.  Pt denies neurological change such as new headache, facial or extremity weakness.  Pt denies polydipsia, polyuria, or low sugar symptoms. Pt states overall good compliance with treatment and medications, good tolerability, and has been trying to follow appropriate diet.  Pt denies worsening depressive symptoms, suicidal ideation or panic. No fever, night sweats, wt loss, loss of appetite, or other constitutional symptoms.  Pt states good ability with ADL's, has low fall risk, home safety reviewed and adequate, no other significant changes in hearing or vision, and only occasionally active with exercise, less with pandemic.  BP has been slightly elevated in the AM recently BP Readings from Last 3 Encounters:  10/15/19 (!) 142/84  10/10/18 120/72  10/04/17 112/78   Wt Readings from Last 3 Encounters:  10/15/19 121 lb (54.9 kg)  10/10/18 115 lb (52.2 kg)  10/04/17 116 lb (52.6 kg)   Past Medical History:  Diagnosis Date  . Environmental allergies   . Hyperlipidemia   . Hypertension    Past Surgical History:  Procedure Laterality Date  . APPENDECTOMY  2000    reports that he has quit smoking. He has never used smokeless tobacco. He reports current alcohol use. He reports that he does not use drugs. family history includes Hyperlipidemia in his son; Hypertension in an other family member. No Known Allergies.med Current Outpatient Medications on File Prior to Visit  Medication Sig Dispense Refill  . Fiber CHEW Chew by mouth daily.    . Multiple Vitamin (MULTIVITAMIN) tablet Take 1 tablet by mouth daily.     No current facility-administered medications on file prior to visit.    Review of Systems All otherwise neg per  pt     Objective:   Physical Exam BP (!) 142/84   Pulse (!) 107   Temp 97.9 F (36.6 C)   Ht 5' (1.524 m)   Wt 121 lb (54.9 kg)   SpO2 100%   BMI 23.63 kg/m  VS noted,  Constitutional: Pt appears in NAD HENT: Head: NCAT.  Right Ear: External ear normal.  Left Ear: External ear normal.  Eyes: . Pupils are equal, round, and reactive to light. Conjunctivae and EOM are normal Nose: without d/c or deformity Neck: Neck supple. Gross normal ROM Cardiovascular: Normal rate and regular rhythm.   Pulmonary/Chest: Effort normal and breath sounds without rales or wheezing.  Abd:  Soft, NT, ND, + BS, no organomegaly Neurological: Pt is alert. At baseline orientation, motor grossly intact Skin: Skin is warm. No rashes, other new lesions, no LE edema Psychiatric: Pt behavior is normal without agitation  All otherwise neg per pt Lab Results  Component Value Date   WBC 5.2 10/08/2019   HGB 14.4 10/08/2019   HCT 43.0 10/08/2019   PLT 210.0 10/08/2019   GLUCOSE 86 10/08/2019   CHOL 151 10/08/2019   TRIG 177.0 (H) 10/08/2019   HDL 44.30 10/08/2019   LDLDIRECT 122.4 08/26/2011   LDLCALC 72 10/08/2019   ALT 22 10/08/2019   AST 17 10/08/2019   NA 137 10/08/2019   K 4.0 10/08/2019   CL 101 10/08/2019   CREATININE 0.94 10/08/2019   BUN 17 10/08/2019   CO2 28 10/08/2019   TSH 1.22 10/08/2019  PSA 1.47 10/08/2019   INR 1.00 05/06/2014   HGBA1C 5.6 10/08/2019          Assessment & Plan:

## 2019-10-15 NOTE — Assessment & Plan Note (Signed)

## 2019-10-15 NOTE — Patient Instructions (Signed)
Ok to increase the Amlodipine to 10 mg per day  Please continue all other medications as before, and refills have been done if requested.  Please have the pharmacy call with any other refills you may need.  Please continue your efforts at being more active, low cholesterol diet, and weight control.  You are otherwise up to date with prevention measures today.  Please keep your appointments with your specialists as you may have planned  Please make an Appointment to return for your 1 year visit, or sooner if needed, with Lab testing by Appointment as well, to be done about 3-5 days before at the [facility] (so this is for TWO appointments - please see the scheduling desk as you leave)

## 2020-10-13 ENCOUNTER — Other Ambulatory Visit: Payer: PPO

## 2020-10-15 ENCOUNTER — Other Ambulatory Visit: Payer: Self-pay

## 2020-10-15 ENCOUNTER — Other Ambulatory Visit (INDEPENDENT_AMBULATORY_CARE_PROVIDER_SITE_OTHER): Payer: PPO

## 2020-10-15 DIAGNOSIS — Z0000 Encounter for general adult medical examination without abnormal findings: Secondary | ICD-10-CM | POA: Diagnosis not present

## 2020-10-15 LAB — CBC WITH DIFFERENTIAL/PLATELET
Basophils Absolute: 0 10*3/uL (ref 0.0–0.1)
Basophils Relative: 0.6 % (ref 0.0–3.0)
Eosinophils Absolute: 0.2 10*3/uL (ref 0.0–0.7)
Eosinophils Relative: 3.4 % (ref 0.0–5.0)
HCT: 42.4 % (ref 39.0–52.0)
Hemoglobin: 14.1 g/dL (ref 13.0–17.0)
Lymphocytes Relative: 35.7 % (ref 12.0–46.0)
Lymphs Abs: 2.1 10*3/uL (ref 0.7–4.0)
MCHC: 33.2 g/dL (ref 30.0–36.0)
MCV: 92.1 fl (ref 78.0–100.0)
Monocytes Absolute: 0.4 10*3/uL (ref 0.1–1.0)
Monocytes Relative: 7.3 % (ref 3.0–12.0)
Neutro Abs: 3.1 10*3/uL (ref 1.4–7.7)
Neutrophils Relative %: 53 % (ref 43.0–77.0)
Platelets: 211 10*3/uL (ref 150.0–400.0)
RBC: 4.61 Mil/uL (ref 4.22–5.81)
RDW: 12.7 % (ref 11.5–15.5)
WBC: 5.9 10*3/uL (ref 4.0–10.5)

## 2020-10-15 LAB — URINALYSIS, ROUTINE W REFLEX MICROSCOPIC
Bilirubin Urine: NEGATIVE
Hgb urine dipstick: NEGATIVE
Ketones, ur: NEGATIVE
Leukocytes,Ua: NEGATIVE
Nitrite: NEGATIVE
RBC / HPF: NONE SEEN (ref 0–?)
Specific Gravity, Urine: 1.02 (ref 1.000–1.030)
Total Protein, Urine: NEGATIVE
Urine Glucose: NEGATIVE
Urobilinogen, UA: 0.2 (ref 0.0–1.0)
pH: 6.5 (ref 5.0–8.0)

## 2020-10-15 LAB — HEPATIC FUNCTION PANEL
ALT: 20 U/L (ref 0–53)
AST: 15 U/L (ref 0–37)
Albumin: 4.3 g/dL (ref 3.5–5.2)
Alkaline Phosphatase: 39 U/L (ref 39–117)
Bilirubin, Direct: 0.1 mg/dL (ref 0.0–0.3)
Total Bilirubin: 0.5 mg/dL (ref 0.2–1.2)
Total Protein: 7.4 g/dL (ref 6.0–8.3)

## 2020-10-15 LAB — TSH: TSH: 1.17 u[IU]/mL (ref 0.35–4.50)

## 2020-10-15 LAB — BASIC METABOLIC PANEL
BUN: 18 mg/dL (ref 6–23)
CO2: 29 mEq/L (ref 19–32)
Calcium: 9.3 mg/dL (ref 8.4–10.5)
Chloride: 103 mEq/L (ref 96–112)
Creatinine, Ser: 0.9 mg/dL (ref 0.40–1.50)
GFR: 82.78 mL/min (ref >60.00)
Glucose, Bld: 92 mg/dL (ref 70–99)
Potassium: 3.8 mEq/L (ref 3.5–5.1)
Sodium: 138 mEq/L (ref 135–145)

## 2020-10-15 LAB — PSA: PSA: 1.89 ng/mL (ref 0.10–4.00)

## 2020-10-15 LAB — LIPID PANEL
Cholesterol: 155 mg/dL (ref 0–200)
HDL: 52.5 mg/dL (ref >39.00)
LDL Cholesterol: 82 mg/dL (ref 0–99)
NonHDL: 102.28
Total CHOL/HDL Ratio: 3
Triglycerides: 103 mg/dL (ref 0.0–149.0)
VLDL: 20.6 mg/dL (ref 0.0–40.0)

## 2020-10-19 ENCOUNTER — Other Ambulatory Visit: Payer: Self-pay

## 2020-10-20 ENCOUNTER — Ambulatory Visit (INDEPENDENT_AMBULATORY_CARE_PROVIDER_SITE_OTHER): Payer: PPO | Admitting: Internal Medicine

## 2020-10-20 ENCOUNTER — Encounter: Payer: Self-pay | Admitting: Internal Medicine

## 2020-10-20 VITALS — BP 140/80 | HR 80 | Temp 98.4°F | Ht 60.0 in | Wt 121.0 lb

## 2020-10-20 DIAGNOSIS — E7800 Pure hypercholesterolemia, unspecified: Secondary | ICD-10-CM | POA: Diagnosis not present

## 2020-10-20 DIAGNOSIS — Z0000 Encounter for general adult medical examination without abnormal findings: Secondary | ICD-10-CM | POA: Diagnosis not present

## 2020-10-20 DIAGNOSIS — E538 Deficiency of other specified B group vitamins: Secondary | ICD-10-CM

## 2020-10-20 DIAGNOSIS — R739 Hyperglycemia, unspecified: Secondary | ICD-10-CM | POA: Diagnosis not present

## 2020-10-20 DIAGNOSIS — H04129 Dry eye syndrome of unspecified lacrimal gland: Secondary | ICD-10-CM

## 2020-10-20 DIAGNOSIS — Z0001 Encounter for general adult medical examination with abnormal findings: Secondary | ICD-10-CM

## 2020-10-20 DIAGNOSIS — I10 Essential (primary) hypertension: Secondary | ICD-10-CM

## 2020-10-20 DIAGNOSIS — E559 Vitamin D deficiency, unspecified: Secondary | ICD-10-CM

## 2020-10-20 DIAGNOSIS — Z23 Encounter for immunization: Secondary | ICD-10-CM | POA: Diagnosis not present

## 2020-10-20 MED ORDER — ATORVASTATIN CALCIUM 10 MG PO TABS: 10.0000 mg | ORAL_TABLET | ORAL | 3 refills | Status: DC

## 2020-10-20 MED ORDER — AMLODIPINE BESYLATE 10 MG PO TABS: 10.0000 mg | ORAL_TABLET | Freq: Every day | ORAL | 3 refills | Status: DC

## 2020-10-20 MED ORDER — LISINOPRIL 20 MG PO TABS: 20.0000 mg | ORAL_TABLET | Freq: Every day | ORAL | 3 refills | Status: DC

## 2020-10-20 NOTE — Patient Instructions (Addendum)
You had the Tdap tetanus shot today  Ok to use the OTC [place] Eye drops for dryness in the right eye  Please continue all other medications as before, and refills have been done if requested.  Please have the pharmacy call with any other refills you may need.  Please continue your efforts at being more active, low cholesterol diet, and weight control.  You are otherwise up to date with prevention measures today.  Please keep your appointments with your specialists as you may have planned  Please make an Appointment to return for your 1 year visit, or sooner if needed, with Lab testing by Appointment as well, to be done about 3-5 days before at the [facility] (so this is for TWO appointments - please see the scheduling desk as you leave)  Due to the ongoing Covid 19 pandemic, our lab now requires an appointment for any labs done at our office.  If you need labs done and do not have an appointment, please call our office ahead of time to schedule before presenting to the lab for your testing.

## 2020-10-20 NOTE — Assessment & Plan Note (Signed)
BP Readings from Last 3 Encounters:  10/20/20 140/80  10/15/19 (!) 142/84  10/10/18 120/72   Stable, pt to continue medical treatment norvasc, lisinopril

## 2020-10-20 NOTE — Progress Notes (Signed)
Patient ID: Douglas Miles, male   DOB: 01/08/1946, 75 y.o.   MRN: [id number]  .        Chief Complaint:: wellness exam       HPI:  Douglas Miles is a 75 y.o. male here for wellness exam, due for Tdap, o/w up to date with preventive referrals, and immunizations.  Pt denies chest pain, increased sob or doe, wheezing, orthopnea, PND, increased LE swelling, palpitations, dizziness or syncope.   Pt denies polydipsia, polyuria, Denies worsening focal neuro s/s.  Has ongoing right dry eye occasionally but no pain or vision change.   Pt denies fever, wt loss, night sweats, loss of appetite, or other constitutional symptoms   Wt Readings from Last 3 Encounters:  10/20/20 121 lb (54.9 kg)  10/15/19 121 lb (54.9 kg)  10/10/18 115 lb (52.2 kg)   BP Readings from Last 3 Encounters:  10/20/20 140/80  10/15/19 (!) 142/84  10/10/18 120/72   Immunization History  Administered Date(s) Administered  . Fluad Quad(high Dose 65+) 05/11/2020  . Hepatitis B 09/20/1995  . Influenza Whole 05/13/2019  . Influenza, High Dose Seasonal PF 05/15/2015, 04/27/2018  . Influenza-Unspecified 04/08/2017  . PFIZER(Purple Top)SARS-COV-2 Vaccination 08/22/2019, 09/10/2019, 05/25/2020  . Pneumococcal Conjugate-13 08/08/2009, 09/06/2013  . Pneumococcal Polysaccharide-23 10/04/2017  . Td 01/17/1995, 03/20/1995  . Tdap 08/08/2009, 10/20/2020  . Varicella 08/08/2009   There are no preventive care reminders to display for this patient.    Past Medical History:  Diagnosis Date  . Environmental allergies   . Hyperlipidemia   . Hypertension    Past Surgical History:  Procedure Laterality Date  . APPENDECTOMY  2000    reports that he has quit smoking. He has never used smokeless tobacco. He reports current alcohol use. He reports that he does not use drugs. family history includes Hyperlipidemia in his son; Hypertension in an other family member. No Known Allergies Current Outpatient Medications on File Prior to Visit   Medication Sig Dispense Refill  . chlorpheniramine (CHLOR-TRIMETON) 4 MG tablet Take 4 mg by mouth 2 (two) times daily as needed for allergies.    . Fiber CHEW Chew by mouth daily.    . Multiple Vitamin (MULTIVITAMIN) tablet Take 1 tablet by mouth daily.     No current facility-administered medications on file prior to visit.        ROS:  All others reviewed and negative.  Objective        PE:  BP 140/80   Pulse 80   Temp 98.4 F (36.9 C) (Oral)   Ht 5' (1.524 m)   Wt 121 lb (54.9 kg)   SpO2 98%   BMI 23.63 kg/m                 Constitutional: Pt appears in NAD               HENT: Head: NCAT.                Right Ear: External ear normal.                 Left Ear: External ear normal.                Eyes: . Pupils are equal, round, and reactive to light. Conjunctivae and EOM are normal               Nose: without d/c or deformity  Neck: Neck supple. Gross normal ROM               Cardiovascular: Normal rate and regular rhythm.                 Pulmonary/Chest: Effort normal and breath sounds without rales or wheezing.                Abd:  Soft, NT, ND, + BS, no organomegaly               Neurological: Pt is alert. At baseline orientation, motor grossly intact               Skin: Skin is warm. No rashes, no other new lesions, LE edema - none               Psychiatric: Pt behavior is normal without agitation   Micro: none  Cardiac tracings I have personally interpreted today:  none  Pertinent Radiological findings (summarize): none   Lab Results  Component Value Date   WBC 5.9 10/15/2020   HGB 14.1 10/15/2020   HCT 42.4 10/15/2020   PLT 211.0 10/15/2020   GLUCOSE 92 10/15/2020   CHOL 155 10/15/2020   TRIG 103.0 10/15/2020   HDL 52.50 10/15/2020   LDLDIRECT 122.4 08/26/2011   LDLCALC 82 10/15/2020   ALT 20 10/15/2020   AST 15 10/15/2020   NA 138 10/15/2020   K 3.8 10/15/2020   CL 103 10/15/2020   CREATININE 0.90 10/15/2020   BUN 18 10/15/2020    CO2 29 10/15/2020   TSH 1.17 10/15/2020   PSA 1.89 10/15/2020   INR 1.00 05/06/2014   HGBA1C 5.6 10/08/2019   Assessment/Plan:  Douglas Miles is a 75 y.o. Other or two or more races [6] male with  has a past medical history of Environmental allergies, Hyperlipidemia, and Hypertension.  Encounter for well adult exam with abnormal findings Age and sex appropriate education and counseling updated with regular exercise and diet Referrals for preventative services - none needed Immunizations addressed - for Tdap Smoking counseling  - none needed Evidence for depression or other mood disorder - none significant Most recent labs reviewed. I have personally reviewed and have noted: 1) the patient's medical and social history 2) The patient's current medications and supplements 3) The patient's height, weight, and BMI have been recorded in the chart   Hyperglycemia Lab Results  Component Value Date   HGBA1C 5.6 10/08/2019   Stable, pt to continue current medical treatment  - diet and wt control   Hyperlipidemia Lab Results  Component Value Date   LDLCALC 82 10/15/2020   Stable, pt to continue current statin lipitor 10   Hypertension BP Readings from Last 3 Encounters:  10/20/20 140/80  10/15/19 (!) 142/84  10/10/18 120/72   Stable, pt to continue medical treatment norvasc, lisinopril   Dry eye Exam benign, ok for murine otc eye drops, consider optho for any worsening  Followup: Return in about 1 year (around 10/20/2021).  Cathlean Cower, MD 10/20/2020 9:46 PM [facility]

## 2020-10-20 NOTE — Assessment & Plan Note (Signed)
Lab Results  Component Value Date   LDLCALC 82 10/15/2020   Stable, pt to continue current statin lipitor 10

## 2020-10-20 NOTE — Assessment & Plan Note (Signed)
Exam benign, ok for murine otc eye drops, consider optho for any worsening

## 2020-10-20 NOTE — Assessment & Plan Note (Signed)
Age and sex appropriate education and counseling updated with regular exercise and diet Referrals for preventative services - none needed Immunizations addressed - for Tdap Smoking counseling  - none needed Evidence for depression or other mood disorder - none significant Most recent labs reviewed. I have personally reviewed and have noted: 1) the patient's medical and social history 2) The patient's current medications and supplements 3) The patient's height, weight, and BMI have been recorded in the chart

## 2020-10-20 NOTE — Assessment & Plan Note (Signed)
Lab Results  Component Value Date   HGBA1C 5.6 10/08/2019   Stable, pt to continue current medical treatment  - diet and wt control

## 2021-10-06 ENCOUNTER — Other Ambulatory Visit: Payer: Self-pay | Admitting: Internal Medicine

## 2021-10-06 NOTE — Telephone Encounter (Signed)
Please refill as per office routine med refill policy (all routine meds to be refilled for 3 mo or monthly (per pt preference) up to one year from last visit, then month to month grace period for 3 mo, then further med refills will have to be denied) ? ?

## 2021-10-19 ENCOUNTER — Other Ambulatory Visit: Payer: Self-pay

## 2021-10-19 ENCOUNTER — Other Ambulatory Visit (INDEPENDENT_AMBULATORY_CARE_PROVIDER_SITE_OTHER): Payer: PPO

## 2021-10-19 DIAGNOSIS — E538 Deficiency of other specified B group vitamins: Secondary | ICD-10-CM | POA: Diagnosis not present

## 2021-10-19 DIAGNOSIS — E559 Vitamin D deficiency, unspecified: Secondary | ICD-10-CM | POA: Diagnosis not present

## 2021-10-19 DIAGNOSIS — Z0000 Encounter for general adult medical examination without abnormal findings: Secondary | ICD-10-CM

## 2021-10-19 DIAGNOSIS — E7800 Pure hypercholesterolemia, unspecified: Secondary | ICD-10-CM | POA: Diagnosis not present

## 2021-10-19 LAB — CBC WITH DIFFERENTIAL/PLATELET
Basophils Absolute: 0 10*3/uL (ref 0.0–0.1)
Basophils Relative: 0.6 % (ref 0.0–3.0)
Eosinophils Absolute: 0.2 10*3/uL (ref 0.0–0.7)
Eosinophils Relative: 3.8 % (ref 0.0–5.0)
HCT: 40.4 % (ref 39.0–52.0)
Hemoglobin: 13.3 g/dL (ref 13.0–17.0)
Lymphocytes Relative: 34.8 % (ref 12.0–46.0)
Lymphs Abs: 2.3 10*3/uL (ref 0.7–4.0)
MCHC: 33 g/dL (ref 30.0–36.0)
MCV: 92.6 fl (ref 78.0–100.0)
Monocytes Absolute: 0.6 10*3/uL (ref 0.1–1.0)
Monocytes Relative: 9.8 % (ref 3.0–12.0)
Neutro Abs: 3.3 10*3/uL (ref 1.4–7.7)
Neutrophils Relative %: 51 % (ref 43.0–77.0)
Platelets: 206 10*3/uL (ref 150.0–400.0)
RBC: 4.36 Mil/uL (ref 4.22–5.81)
RDW: 12.9 % (ref 11.5–15.5)
WBC: 6.5 10*3/uL (ref 4.0–10.5)

## 2021-10-19 LAB — URINALYSIS, ROUTINE W REFLEX MICROSCOPIC
Bilirubin Urine: NEGATIVE
Hgb urine dipstick: NEGATIVE
Ketones, ur: NEGATIVE
Leukocytes,Ua: NEGATIVE
Nitrite: NEGATIVE
RBC / HPF: NONE SEEN (ref 0–?)
Specific Gravity, Urine: 1.02 (ref 1.000–1.030)
Total Protein, Urine: NEGATIVE
Urine Glucose: NEGATIVE
Urobilinogen, UA: 0.2 (ref 0.0–1.0)
pH: 6 (ref 5.0–8.0)

## 2021-10-19 LAB — BASIC METABOLIC PANEL
BUN: 22 mg/dL (ref 6–23)
CO2: 29 mEq/L (ref 19–32)
Calcium: 9.6 mg/dL (ref 8.4–10.5)
Chloride: 101 mEq/L (ref 96–112)
Creatinine, Ser: 0.77 mg/dL (ref 0.40–1.50)
GFR: 86.16 mL/min (ref >60.00)
Glucose, Bld: 91 mg/dL (ref 70–99)
Potassium: 3.9 mEq/L (ref 3.5–5.1)
Sodium: 137 mEq/L (ref 135–145)

## 2021-10-19 LAB — LIPID PANEL
Cholesterol: 133 mg/dL (ref 0–200)
HDL: 50.2 mg/dL (ref >39.00)
LDL Cholesterol: 66 mg/dL (ref 0–99)
NonHDL: 82.37
Total CHOL/HDL Ratio: 3
Triglycerides: 84 mg/dL (ref 0.0–149.0)
VLDL: 16.8 mg/dL (ref 0.0–40.0)

## 2021-10-19 LAB — HEPATIC FUNCTION PANEL
ALT: 16 U/L (ref 0–53)
AST: 20 U/L (ref 0–37)
Albumin: 4.5 g/dL (ref 3.5–5.2)
Alkaline Phosphatase: 38 U/L — ABNORMAL LOW (ref 39–117)
Bilirubin, Direct: 0.1 mg/dL (ref 0.0–0.3)
Total Bilirubin: 0.4 mg/dL (ref 0.2–1.2)
Total Protein: 7.3 g/dL (ref 6.0–8.3)

## 2021-10-19 LAB — VITAMIN B12: Vitamin B-12: >1504 pg/mL — ABNORMAL HIGH (ref 211–911)

## 2021-10-19 LAB — TSH: TSH: 1.5 u[IU]/mL (ref 0.35–5.50)

## 2021-10-19 LAB — VITAMIN D 25 HYDROXY (VIT D DEFICIENCY, FRACTURES): VITD: 52.03 ng/mL (ref 30.00–100.00)

## 2021-10-19 LAB — PSA: PSA: 1.19 ng/mL (ref 0.10–4.00)

## 2021-10-21 ENCOUNTER — Other Ambulatory Visit: Payer: Self-pay | Admitting: Internal Medicine

## 2021-10-21 NOTE — Telephone Encounter (Signed)
Please refill as per office routine med refill policy (all routine meds to be refilled for 3 mo or monthly (per pt preference) up to one year from last visit, then month to month grace period for 3 mo, then further med refills will have to be denied) ? ?

## 2021-10-26 ENCOUNTER — Encounter: Payer: PPO | Admitting: Internal Medicine

## 2021-11-11 ENCOUNTER — Encounter: Payer: Self-pay | Admitting: Internal Medicine

## 2021-11-11 ENCOUNTER — Ambulatory Visit (INDEPENDENT_AMBULATORY_CARE_PROVIDER_SITE_OTHER): Payer: PPO | Admitting: Internal Medicine

## 2021-11-11 DIAGNOSIS — Z0000 Encounter for general adult medical examination without abnormal findings: Secondary | ICD-10-CM | POA: Diagnosis not present

## 2021-11-11 MED ORDER — LISINOPRIL 20 MG PO TABS: 20.0000 mg | ORAL_TABLET | Freq: Every day | ORAL | 3 refills | Status: DC

## 2021-11-11 MED ORDER — ATORVASTATIN CALCIUM 20 MG PO TABS: 20.0000 mg | ORAL_TABLET | Freq: Every day | ORAL | 3 refills | Status: DC

## 2021-11-11 MED ORDER — AMLODIPINE BESYLATE 10 MG PO TABS: 10.0000 mg | ORAL_TABLET | Freq: Every day | ORAL | 3 refills | Status: DC

## 2021-11-11 MED ORDER — ATORVASTATIN CALCIUM 10 MG PO TABS: 10.0000 mg | ORAL_TABLET | ORAL | 3 refills | Status: DC

## 2021-11-11 NOTE — Patient Instructions (Addendum)
Please continue all other medications as before, and refills have been done if requested. ° °Please have the pharmacy call with any other refills you may need. ° °Please continue your efforts at being more active, low cholesterol diet, and weight control. ° °You are otherwise up to date with prevention measures today. ° °Please keep your appointments with your specialists as you may have planned ° °Please make an Appointment to return in 6 months, or sooner if needed °

## 2021-11-11 NOTE — Progress Notes (Signed)
Patient ID: Douglas Miles, male   DOB: [birth year], 76 y.o.   MRN: [id number] ? ? ? ?     Chief Complaint:: wellness exam  ? ?     HPI:  Douglas Miles is a 76 y.o. male here for wellness exam; decliens covid booster, shingrix, o/w up to date ?         ?              Also Pt denies chest pain, increased sob or doe, wheezing, orthopnea, PND, increased LE swelling, palpitations, dizziness or syncope.   Pt denies polydipsia, polyuria, or new focal neuro s/s.    Pt denies fever, wt loss, night sweats, loss of appetite, or other constitutional symptoms   ?  ?Wt Readings from Last 3 Encounters:  ?11/11/21 119 lb (54 kg)  ?10/20/20 121 lb (54.9 kg)  ?10/15/19 121 lb (54.9 kg)  ? ?BP Readings from Last 3 Encounters:  ?11/11/21 104/62  ?10/20/20 140/80  ?10/15/19 (!) 142/84  ? ?Immunization History  ?Administered Date(s) Administered  ? Fluad Quad(high Dose 65+) 05/11/2020  ? Hepatitis B 09/20/1995  ? Influenza Whole 05/13/2019  ? Influenza, High Dose Seasonal PF 05/15/2015, 04/27/2018  ? Influenza-Unspecified 04/08/2017, 05/05/2021  ? PFIZER(Purple Top)SARS-COV-2 Vaccination 08/22/2019, 09/10/2019, 05/25/2020  ? Pneumococcal Conjugate-13 08/08/2009, 09/06/2013  ? Pneumococcal Polysaccharide-23 10/04/2017  ? Td 01/17/1995, 03/20/1995  ? Tdap 08/08/2009, 10/20/2020  ? Varicella 08/08/2009  ? ?There are no preventive care reminders to display for this patient. ? ?  ? ?Past Medical History:  ?Diagnosis Date  ? Environmental allergies   ? Hyperlipidemia   ? Hypertension   ? ?Past Surgical History:  ?Procedure Laterality Date  ? APPENDECTOMY  2000  ? ? reports that he has quit smoking. He has never used smokeless tobacco. He reports current alcohol use. He reports that he does not use drugs. ?family history includes Hyperlipidemia in his son; Hypertension in an other family member. ?No Known Allergies ?Current Outpatient Medications on File Prior to Visit  ?Medication Sig Dispense Refill  ? chlorpheniramine (CHLOR-TRIMETON) 4 MG tablet Take  4 mg by mouth 2 (two) times daily as needed for allergies.    ? Fiber CHEW Chew by mouth daily.    ? Multiple Vitamin (MULTIVITAMIN) tablet Take 1 tablet by mouth daily.    ? ?No current facility-administered medications on file prior to visit.  ? ?     ROS:  All others reviewed and negative. ? ?Objective  ? ?     PE:  BP 104/62 (BP Location: Left Arm, Patient Position: Sitting, Cuff Size: Normal)   Pulse 73   Temp 98.6 ?F (37 ?C) (Oral)   Ht 5' (1.524 m)   Wt 119 lb (54 kg)   SpO2 97%   BMI 23.24 kg/m?  ? ?              Constitutional: Pt appears in NAD ?              HENT: Head: NCAT.  ?              Right Ear: External ear normal.   ?              Left Ear: External ear normal.  ?              Eyes: . Pupils are equal, round, and reactive to light. Conjunctivae and EOM are normal ?  Nose: without d/c or deformity ?              Neck: Neck supple. Gross normal ROM ?              Cardiovascular: Normal rate and regular rhythm.   ?              Pulmonary/Chest: Effort normal and breath sounds without rales or wheezing.  ?              Abd:  Soft, NT, ND, + BS, no organomegaly ?              Neurological: Pt is alert. At baseline orientation, motor grossly intact ?              Skin: Skin is warm. No rashes, no other new lesions, LE edema - none ?              Psychiatric: Pt behavior is normal without agitation  ? ?Micro: none ? ?Cardiac tracings I have personally interpreted today:  none ? ?Pertinent Radiological findings (summarize): none  ? ?Lab Results  ?Component Value Date  ? WBC 6.5 10/19/2021  ? HGB 13.3 10/19/2021  ? HCT 40.4 10/19/2021  ? PLT 206.0 10/19/2021  ? GLUCOSE 91 10/19/2021  ? CHOL 133 10/19/2021  ? TRIG 84.0 10/19/2021  ? HDL 50.20 10/19/2021  ? LDLDIRECT 122.4 08/26/2011  ? [place] 66 10/19/2021  ? ALT 16 10/19/2021  ? AST 20 10/19/2021  ? NA 137 10/19/2021  ? K 3.9 10/19/2021  ? CL 101 10/19/2021  ? CREATININE 0.77 10/19/2021  ? BUN 22 10/19/2021  ? CO2 29 10/19/2021  ? TSH  1.50 10/19/2021  ? PSA 1.19 10/19/2021  ? INR 1.00 05/06/2014  ? HGBA1C 5.6 10/08/2019  ? ?Assessment/Plan:  ?Douglas Miles is a 76 y.o. Asian [4] male with  has a past medical history of Environmental allergies, Hyperlipidemia, and Hypertension. ? ?Preventative health care ?Age and sex appropriate education and counseling updated with regular exercise and diet ?Referrals for preventative services - none needed ?Immunizations addressed - declines covid booster, shingrix ?Smoking counseling  - none needed ?Evidence for depression or other mood disorder - none significant ?Most recent labs reviewed. ?I have personally reviewed and have noted: ?1) the patient's medical and social history ?2) The patient's current medications and supplements ?3) The patient's height, weight, and BMI have been recorded in the chart ? ?Followup: Return in about 6 months (around 05/13/2022). ? ?Cathlean Cower, MD 11/13/2021 3:45 PM ?[place] [facility] ?[facility] ?

## 2021-11-13 ENCOUNTER — Encounter: Payer: Self-pay | Admitting: Internal Medicine

## 2021-11-13 NOTE — Assessment & Plan Note (Signed)

## 2021-11-15 ENCOUNTER — Encounter: Payer: Self-pay | Admitting: Internal Medicine

## 2021-11-15 DIAGNOSIS — E559 Vitamin D deficiency, unspecified: Secondary | ICD-10-CM

## 2021-11-15 DIAGNOSIS — E7800 Pure hypercholesterolemia, unspecified: Secondary | ICD-10-CM

## 2021-11-15 DIAGNOSIS — Z0001 Encounter for general adult medical examination with abnormal findings: Secondary | ICD-10-CM

## 2021-11-15 DIAGNOSIS — R739 Hyperglycemia, unspecified: Secondary | ICD-10-CM

## 2021-11-15 DIAGNOSIS — E538 Deficiency of other specified B group vitamins: Secondary | ICD-10-CM

## 2022-05-16 ENCOUNTER — Ambulatory Visit: Payer: PPO | Admitting: Internal Medicine

## 2022-08-09 ENCOUNTER — Encounter: Payer: Self-pay | Admitting: Internal Medicine

## 2022-08-09 DIAGNOSIS — H538 Other visual disturbances: Secondary | ICD-10-CM

## 2022-08-09 NOTE — Telephone Encounter (Signed)
Ok this is done 

## 2022-08-30 DIAGNOSIS — H2513 Age-related nuclear cataract, bilateral: Secondary | ICD-10-CM | POA: Diagnosis not present

## 2022-08-30 DIAGNOSIS — H35033 Hypertensive retinopathy, bilateral: Secondary | ICD-10-CM | POA: Diagnosis not present

## 2022-08-30 DIAGNOSIS — H25013 Cortical age-related cataract, bilateral: Secondary | ICD-10-CM | POA: Diagnosis not present

## 2022-08-30 DIAGNOSIS — H35373 Puckering of macula, bilateral: Secondary | ICD-10-CM | POA: Diagnosis not present

## 2022-08-31 DIAGNOSIS — H35371 Puckering of macula, right eye: Secondary | ICD-10-CM | POA: Diagnosis not present

## 2022-08-31 DIAGNOSIS — H2513 Age-related nuclear cataract, bilateral: Secondary | ICD-10-CM | POA: Diagnosis not present

## 2022-10-14 DIAGNOSIS — H25812 Combined forms of age-related cataract, left eye: Secondary | ICD-10-CM | POA: Diagnosis not present

## 2022-10-14 DIAGNOSIS — H25813 Combined forms of age-related cataract, bilateral: Secondary | ICD-10-CM | POA: Diagnosis not present

## 2022-11-07 ENCOUNTER — Other Ambulatory Visit (INDEPENDENT_AMBULATORY_CARE_PROVIDER_SITE_OTHER): Payer: PPO

## 2022-11-07 DIAGNOSIS — E559 Vitamin D deficiency, unspecified: Secondary | ICD-10-CM

## 2022-11-07 DIAGNOSIS — E538 Deficiency of other specified B group vitamins: Secondary | ICD-10-CM

## 2022-11-07 DIAGNOSIS — E7800 Pure hypercholesterolemia, unspecified: Secondary | ICD-10-CM | POA: Diagnosis not present

## 2022-11-07 DIAGNOSIS — Z0001 Encounter for general adult medical examination with abnormal findings: Secondary | ICD-10-CM

## 2022-11-07 DIAGNOSIS — R739 Hyperglycemia, unspecified: Secondary | ICD-10-CM

## 2022-11-07 LAB — HEPATIC FUNCTION PANEL
ALT: 15 U/L (ref 0–53)
AST: 18 U/L (ref 0–37)
Albumin: 4.4 g/dL (ref 3.5–5.2)
Alkaline Phosphatase: 40 U/L (ref 39–117)
Bilirubin, Direct: 0.1 mg/dL (ref 0.0–0.3)
Total Bilirubin: 0.5 mg/dL (ref 0.2–1.2)
Total Protein: 7.3 g/dL (ref 6.0–8.3)

## 2022-11-07 LAB — LIPID PANEL
Cholesterol: 124 mg/dL (ref 0–200)
HDL: 45.7 mg/dL (ref >39.00)
LDL Cholesterol: 62 mg/dL (ref 0–99)
NonHDL: 78.13
Total CHOL/HDL Ratio: 3
Triglycerides: 80 mg/dL (ref 0.0–149.0)
VLDL: 16 mg/dL (ref 0.0–40.0)

## 2022-11-07 LAB — BASIC METABOLIC PANEL
BUN: 19 mg/dL (ref 6–23)
CO2: 27 mEq/L (ref 19–32)
Calcium: 9 mg/dL (ref 8.4–10.5)
Chloride: 102 mEq/L (ref 96–112)
Creatinine, Ser: 0.91 mg/dL (ref 0.40–1.50)
GFR: 80.52 mL/min (ref >60.00)
Glucose, Bld: 90 mg/dL (ref 70–99)
Potassium: 4 mEq/L (ref 3.5–5.1)
Sodium: 133 mEq/L — ABNORMAL LOW (ref 135–145)

## 2022-11-07 LAB — CBC WITH DIFFERENTIAL/PLATELET
Basophils Absolute: 0 10*3/uL (ref 0.0–0.1)
Basophils Relative: 0.4 % (ref 0.0–3.0)
Eosinophils Absolute: 0.1 10*3/uL (ref 0.0–0.7)
Eosinophils Relative: 1.4 % (ref 0.0–5.0)
HCT: 40.6 % (ref 39.0–52.0)
Hemoglobin: 13.4 g/dL (ref 13.0–17.0)
Lymphocytes Relative: 25.3 % (ref 12.0–46.0)
Lymphs Abs: 2 10*3/uL (ref 0.7–4.0)
MCHC: 32.9 g/dL (ref 30.0–36.0)
MCV: 93 fl (ref 78.0–100.0)
Monocytes Absolute: 0.8 10*3/uL (ref 0.1–1.0)
Monocytes Relative: 9.8 % (ref 3.0–12.0)
Neutro Abs: 5 10*3/uL (ref 1.4–7.7)
Neutrophils Relative %: 63.1 % (ref 43.0–77.0)
Platelets: 213 10*3/uL (ref 150.0–400.0)
RBC: 4.36 Mil/uL (ref 4.22–5.81)
RDW: 12.8 % (ref 11.5–15.5)
WBC: 8 10*3/uL (ref 4.0–10.5)

## 2022-11-07 LAB — URINALYSIS, ROUTINE W REFLEX MICROSCOPIC
Bilirubin Urine: NEGATIVE
Hgb urine dipstick: NEGATIVE
Ketones, ur: NEGATIVE
Leukocytes,Ua: NEGATIVE
Nitrite: NEGATIVE
RBC / HPF: NONE SEEN (ref 0–?)
Specific Gravity, Urine: 1.015 (ref 1.000–1.030)
Total Protein, Urine: NEGATIVE
Urine Glucose: NEGATIVE
Urobilinogen, UA: 0.2 (ref 0.0–1.0)
pH: 6.5 (ref 5.0–8.0)

## 2022-11-07 LAB — TSH: TSH: 1.17 u[IU]/mL (ref 0.35–5.50)

## 2022-11-07 LAB — VITAMIN B12: Vitamin B-12: 1105 pg/mL — ABNORMAL HIGH (ref 211–911)

## 2022-11-07 LAB — HEMOGLOBIN A1C: Hgb A1c MFr Bld: 5.6 % (ref 4.6–6.5)

## 2022-11-07 LAB — PSA: PSA: 1.1 ng/mL (ref 0.10–4.00)

## 2022-11-07 LAB — VITAMIN D 25 HYDROXY (VIT D DEFICIENCY, FRACTURES): VITD: 41.05 ng/mL (ref 30.00–100.00)

## 2022-11-14 ENCOUNTER — Encounter: Payer: Self-pay | Admitting: Internal Medicine

## 2022-11-14 ENCOUNTER — Ambulatory Visit (INDEPENDENT_AMBULATORY_CARE_PROVIDER_SITE_OTHER): Payer: PPO | Admitting: Internal Medicine

## 2022-11-14 VITALS — BP 108/64 | HR 86 | Temp 97.7°F | Ht 60.0 in | Wt 122.0 lb

## 2022-11-14 DIAGNOSIS — I10 Essential (primary) hypertension: Secondary | ICD-10-CM | POA: Diagnosis not present

## 2022-11-14 DIAGNOSIS — Z0001 Encounter for general adult medical examination with abnormal findings: Secondary | ICD-10-CM

## 2022-11-14 DIAGNOSIS — R739 Hyperglycemia, unspecified: Secondary | ICD-10-CM

## 2022-11-14 DIAGNOSIS — E559 Vitamin D deficiency, unspecified: Secondary | ICD-10-CM | POA: Diagnosis not present

## 2022-11-14 DIAGNOSIS — E7800 Pure hypercholesterolemia, unspecified: Secondary | ICD-10-CM

## 2022-11-14 DIAGNOSIS — E538 Deficiency of other specified B group vitamins: Secondary | ICD-10-CM

## 2022-11-14 MED ORDER — LISINOPRIL 20 MG PO TABS: 20.0000 mg | ORAL_TABLET | Freq: Every day | ORAL | 3 refills | Status: DC

## 2022-11-14 MED ORDER — ATORVASTATIN CALCIUM 10 MG PO TABS: 10.0000 mg | ORAL_TABLET | ORAL | 3 refills | Status: DC

## 2022-11-14 MED ORDER — AMLODIPINE BESYLATE 10 MG PO TABS: 10.0000 mg | ORAL_TABLET | Freq: Every day | ORAL | 3 refills | Status: DC

## 2022-11-14 NOTE — Patient Instructions (Addendum)
Please have your Shingrix (shingles) shots done at your local pharmacy.  Your wife lab work is ordered in the system  Please continue all other medications as before, and refills have been done if requested.  Please have the pharmacy call with any other refills you may need.  Please continue your efforts at being more active, low cholesterol diet, and weight control.  You are otherwise up to date with prevention measures today.  Please keep your appointments with your specialists as you may have planned  Please make an Appointment to return for your 1 year visit, or sooner if needed, with Lab testing by Appointment as well, to be done about 3-5 days before at the FIRST FLOOR Lab (so this is for TWO appointments - please see the scheduling desk as you leave)

## 2022-11-14 NOTE — Assessment & Plan Note (Signed)
Lab Results  Component Value Date   LDLCALC 62 11/07/2022   Stable, pt to continue current statin lipitor 10 mg qd

## 2022-11-14 NOTE — Assessment & Plan Note (Signed)
Age and sex appropriate education and counseling updated with regular exercise and diet Referrals for preventative services - none needed Immunizations addressed - declines covid booster, for shingrix at pharmacy Smoking counseling  - none needed Evidence for depression or other mood disorder - none significant Most recent labs reviewed. I have personally reviewed and have noted: 1) the patient's medical and social history 2) The patient's current medications and supplements 3) The patient's height, weight, and BMI have been recorded in the chart  

## 2022-11-14 NOTE — Progress Notes (Signed)
Patient ID: Douglas Miles, male   DOB: 1946-02-29, 77 y.o.   MRN: [id number]         Chief Complaint:: wellness exam and htn, low b12, hyperglycemia, hld       HPI:  Douglas Miles is a 77 y.o. male here for wellness exam; decliens covid booster, for shingrix at pharmacy, o/w up to date                        Also Pt denies chest pain, increased sob or doe, wheezing, orthopnea, PND, increased LE swelling, palpitations, dizziness or syncope.   Pt denies polydipsia, polyuria, or new focal neuro s/s.    Pt denies fever, wt loss, night sweats, loss of appetite, or other constitutional symptoms  Taking b12 daily   Wt Readings from Last 3 Encounters:  11/14/22 122 lb (55.3 kg)  11/11/21 119 lb (54 kg)  10/20/20 121 lb (54.9 kg)   BP Readings from Last 3 Encounters:  11/14/22 108/64  11/11/21 104/62  10/20/20 140/80   Immunization History  Administered Date(s) Administered   Fluad Quad(high Dose 65+) 05/11/2020, 04/22/2022   Hepatitis B 09/20/1995   Influenza Whole 05/13/2019   Influenza, High Dose Seasonal PF 05/15/2015, 04/27/2018   Influenza-Unspecified 04/08/2017, 05/05/2021   PFIZER(Purple Top)SARS-COV-2 Vaccination 08/22/2019, 09/10/2019, 05/25/2020   Pneumococcal Conjugate-13 08/08/2009, 09/06/2013   Pneumococcal Polysaccharide-23 10/04/2017   Td 01/17/1995, 03/20/1995   Tdap 08/08/2009, 10/20/2020   Varicella 08/08/2009   Health Maintenance Due  Topic Date Due   Medicare Annual Wellness (AWV)  Never done      Past Medical History:  Diagnosis Date   Environmental allergies    Hyperlipidemia    Hypertension    Past Surgical History:  Procedure Laterality Date   APPENDECTOMY  2000    reports that he has quit smoking. He has never used smokeless tobacco. He reports current alcohol use. He reports that he does not use drugs. family history includes Hyperlipidemia in his son; Hypertension in an other family member. No Known Allergies Current Outpatient Medications on File Prior  to Visit  Medication Sig Dispense Refill   chlorpheniramine (CHLOR-TRIMETON) 4 MG tablet Take 4 mg by mouth 2 (two) times daily as needed for allergies.     Fiber CHEW Chew by mouth daily.     Multiple Vitamin (MULTIVITAMIN) tablet Take 1 tablet by mouth daily.     No current facility-administered medications on file prior to visit.        ROS:  All others reviewed and negative.  Objective        PE:  BP 108/64   Pulse 86   Temp 97.7 F (36.5 C) (Oral)   Ht 5' (1.524 m)   Wt 122 lb (55.3 kg)   SpO2 96%   BMI 23.83 kg/m                 Constitutional: Pt appears in NAD               HENT: Head: NCAT.                Right Ear: External ear normal.                 Left Ear: External ear normal.                Eyes: . Pupils are equal, round, and reactive to light. Conjunctivae and EOM are normal  Nose: without d/c or deformity               Neck: Neck supple. Gross normal ROM               Cardiovascular: Normal rate and regular rhythm.                 Pulmonary/Chest: Effort normal and breath sounds without rales or wheezing.                Abd:  Soft, NT, ND, + BS, no organomegaly               Neurological: Pt is alert. At baseline orientation, motor grossly intact               Skin: Skin is warm. No rashes, no other new lesions, LE edema - none               Psychiatric: Pt behavior is normal without agitation   Micro: none  Cardiac tracings I have personally interpreted today:  none  Pertinent Radiological findings (summarize): none   Lab Results  Component Value Date   WBC 8.0 11/07/2022   HGB 13.4 11/07/2022   HCT 40.6 11/07/2022   PLT 213.0 11/07/2022   GLUCOSE 90 11/07/2022   CHOL 124 11/07/2022   TRIG 80.0 11/07/2022   HDL 45.70 11/07/2022   LDLDIRECT 122.4 08/26/2011   LDLCALC 62 11/07/2022   ALT 15 11/07/2022   AST 18 11/07/2022   NA 133 (L) 11/07/2022   K 4.0 11/07/2022   CL 102 11/07/2022   CREATININE 0.91 11/07/2022   BUN 19  11/07/2022   CO2 27 11/07/2022   TSH 1.17 11/07/2022   PSA 1.10 11/07/2022   INR 1.00 05/06/2014   HGBA1C 5.6 11/07/2022   Assessment/Plan:  Douglas Miles is a 77 y.o. Asian [4] male with  has a past medical history of Environmental allergies, Hyperlipidemia, and Hypertension.  Encounter for well adult exam with abnormal findings Age and sex appropriate education and counseling updated with regular exercise and diet Referrals for preventative services - none needed Immunizations addressed - declines covid booster, for shingrix at pharmacy Smoking counseling  - none needed Evidence for depression or other mood disorder - none significant Most recent labs reviewed. I have personally reviewed and have noted: 1) the patient's medical and social history 2) The patient's current medications and supplements 3) The patient's height, weight, and BMI have been recorded in the chart   Hyperglycemia Lab Results  Component Value Date   HGBA1C 5.6 11/07/2022   Stable, pt to continue current medical treatment  - diet, wt control   Hyperlipidemia Lab Results  Component Value Date   LDLCALC 62 11/07/2022   Stable, pt to continue current statin lipitor 10 mg qd   Hypertension BP Readings from Last 3 Encounters:  11/14/22 108/64  11/11/21 104/62  10/20/20 140/80   Stable, pt to continue medical treatment norvasc 10 mg qd, lisinopril 20 mg qd   B12 deficiency Now overcontrolled, for reducing b12 qod  Followup: Return in about 1 year (around 11/14/2023).  Oliver Barre, MD 11/14/2022 9:51 PM [facility]

## 2022-11-14 NOTE — Assessment & Plan Note (Signed)
Lab Results  Component Value Date   HGBA1C 5.6 11/07/2022   Stable, pt to continue current medical treatment  - diet, wt control  

## 2022-11-14 NOTE — Assessment & Plan Note (Signed)
BP Readings from Last 3 Encounters:  11/14/22 108/64  11/11/21 104/62  10/20/20 140/80   Stable, pt to continue medical treatment norvasc 10 mg qd, lisinopril 20 mg qd

## 2022-11-14 NOTE — Assessment & Plan Note (Signed)
Now overcontrolled, for reducing b12 qod

## 2022-12-21 DIAGNOSIS — H25812 Combined forms of age-related cataract, left eye: Secondary | ICD-10-CM | POA: Diagnosis not present

## 2022-12-21 DIAGNOSIS — H2511 Age-related nuclear cataract, right eye: Secondary | ICD-10-CM | POA: Diagnosis not present

## 2022-12-21 DIAGNOSIS — H5703 Miosis: Secondary | ICD-10-CM | POA: Diagnosis not present

## 2022-12-21 DIAGNOSIS — H268 Other specified cataract: Secondary | ICD-10-CM | POA: Diagnosis not present

## 2022-12-21 HISTORY — PX: EYE SURGERY: SHX253

## 2023-01-17 DIAGNOSIS — H25811 Combined forms of age-related cataract, right eye: Secondary | ICD-10-CM | POA: Diagnosis not present

## 2023-01-25 DIAGNOSIS — H5709 Other anomalies of pupillary function: Secondary | ICD-10-CM | POA: Diagnosis not present

## 2023-01-25 DIAGNOSIS — H25811 Combined forms of age-related cataract, right eye: Secondary | ICD-10-CM | POA: Diagnosis not present

## 2023-01-26 DIAGNOSIS — Z961 Presence of intraocular lens: Secondary | ICD-10-CM | POA: Diagnosis not present

## 2023-02-24 DIAGNOSIS — Z961 Presence of intraocular lens: Secondary | ICD-10-CM | POA: Diagnosis not present

## 2023-02-24 DIAGNOSIS — H524 Presbyopia: Secondary | ICD-10-CM | POA: Diagnosis not present

## 2023-04-04 ENCOUNTER — Ambulatory Visit (INDEPENDENT_AMBULATORY_CARE_PROVIDER_SITE_OTHER): Payer: PPO

## 2023-04-04 VITALS — Ht 60.0 in | Wt 113.0 lb

## 2023-04-04 DIAGNOSIS — Z0000 Encounter for general adult medical examination without abnormal findings: Secondary | ICD-10-CM | POA: Diagnosis not present

## 2023-04-04 NOTE — Patient Instructions (Addendum)
Douglas Miles , Thank you for taking time to come for your Medicare Wellness Visit. I appreciate your ongoing commitment to your health goals. Please review the following plan we discussed and let me know if I can assist you in the future.   Referrals/Orders/Follow-Ups/Clinician Recommendations: Remember to go and get your 2nd Shingles vaccine and your Flu vaccines soon.  Each day, aim for 6 glasses of water, plenty of protein in your diet and try to get up and walk/ stretch every hour for 5-10 minutes at a time.    This is a list of the screening recommended for you and due dates:  Health Maintenance  Topic Date Due   Zoster (Shingles) Vaccine (1 of 2) Never done   COVID-19 Vaccine (4 - 2023-24 season) 04/08/2022   Flu Shot  03/09/2023   Medicare Annual Wellness Visit  04/03/2024   DTaP/Tdap/Td vaccine (5 - Td or Tdap) 10/21/2030   Pneumonia Vaccine  Completed   Hepatitis C Screening  Completed   HPV Vaccine  Aged Out   Colon Cancer Screening  Discontinued    Advanced directives: (Copy Requested) Please bring a copy of your health care power of attorney and living will to the office to be added to your chart at your convenience.  Next Medicare Annual Wellness Visit scheduled for next year: Yes

## 2023-04-04 NOTE — Progress Notes (Signed)
Subjective:   Douglas Miles is a 77 y.o. male who presents for an Initial Medicare Annual Wellness Visit.  Visit Complete: Virtual  I connected with  Douglas Miles on 04/04/23 by a audio enabled telemedicine application and verified that I am speaking with the correct person using two identifiers.  Patient Location: Home  Provider Location: Home Office  I discussed the limitations of evaluation and management by telemedicine. The patient expressed understanding and agreed to proceed.  Patient Medicare AWV questionnaire was completed by the patient on 04/04/2023; I have confirmed that all information answered by patient is correct and no changes since this date.  Vital Signs: Because this visit was a virtual/telehealth visit, some criteria may be missing or patient reported. Any vitals not documented were not able to be obtained and vitals that have been documented are patient reported.    Review of Systems   Cardiac Risk Factors include: advanced age (>13men, >75 women);male gender;hypertension;dyslipidemia     Objective:    Today's Vitals   04/04/23 0818  Weight: 113 lb (51.3 kg)  Height: 5' (1.524 m)   Body mass index is 22.07 kg/m.     04/04/2023    8:25 AM 10/23/2014    9:01 AM 05/06/2014    7:45 AM  Advanced Directives  Does Patient Have a Medical Advance Directive? Yes No No  Type of Estate agent of [place];Living will    Copy of Healthcare Power of Attorney in Chart? No - copy requested    Would patient like information on creating a medical advance directive?  No - patient declined information No - patient declined information    Current Medications (verified) Outpatient Encounter Medications as of 04/04/2023  Medication Sig   amLODipine (NORVASC) 10 MG tablet Take 1 tablet (10 mg total) by mouth daily.   atorvastatin (LIPITOR) 10 MG tablet Take 1 tablet (10 mg total) by mouth every other day.   Fiber CHEW Chew by mouth daily.   lisinopril  (ZESTRIL) 20 MG tablet Take 1 tablet (20 mg total) by mouth daily.   Multiple Vitamin (MULTIVITAMIN) tablet Take 1 tablet by mouth daily.   chlorpheniramine (CHLOR-TRIMETON) 4 MG tablet Take 4 mg by mouth 2 (two) times daily as needed for allergies.   No facility-administered encounter medications on file as of 04/04/2023.    Allergies (verified) Patient has no known allergies.   History: Past Medical History:  Diagnosis Date   Environmental allergies    Hyperlipidemia    Hypertension    Past Surgical History:  Procedure Laterality Date   APPENDECTOMY  08/08/1998   EYE SURGERY Bilateral 12/21/2022   Cataract surgery   Family History  Problem Relation Age of Onset   Hyperlipidemia Son    Hypertension Other    Colon cancer Neg Hx    Social History   Socioeconomic History   Marital status: Married    Spouse name: Not on file   Number of children: 5   Years of education: 12   Highest education level: Not on file  Occupational History   Occupation: Curator   Occupation: Retired  Tobacco Use   Smoking status: Former   Smokeless tobacco: Never   Tobacco comments:    Quit 15 years ago  Advertising account planner   Vaping status: Never Used  Substance and Sexual Activity   Alcohol use: Not Currently    Comment: 1 beer weekly   Drug use: No   Sexual activity: Not on file  Other Topics  Concern   Not on file  Social History Narrative   Lives with his wife and 2 children.   Social Determinants of Health   Financial Resource Strain: Low Risk  (04/03/2023)   Overall Financial Resource Strain (CARDIA)    Difficulty of Paying Living Expenses: Not hard at all  Food Insecurity: No Food Insecurity (04/03/2023)   Hunger Vital Sign    Worried About Running Out of Food in the Last Year: Never true    Ran Out of Food in the Last Year: Never true  Transportation Needs: No Transportation Needs (04/03/2023)   PRAPARE - Administrator, Civil Service (Medical): No    Lack of  Transportation (Non-Medical): No  Physical Activity: Insufficiently Active (04/03/2023)   Exercise Vital Sign    Days of Exercise per Week: 7 days    Minutes of Exercise per Session: 20 min  Stress: No Stress Concern Present (04/03/2023)   [employer] of Occupational Health - Occupational Stress Questionnaire    Feeling of Stress : Not at all  Social Connections: Unknown (04/03/2023)   Social Connection and Isolation Panel [NHANES]    Frequency of Communication with Friends and Family: More than three times a week    Frequency of Social Gatherings with Friends and Family: More than three times a week    Attends Religious Services: Not on Marketing executive or Organizations: No    Attends Banker Meetings: Never    Marital Status: Married    Tobacco Counseling Counseling given: Not Answered Tobacco comments: Quit 15 years ago   Clinical Intake:  Pre-visit preparation completed: Yes        BMI - recorded: 22.07 Nutritional Status: BMI of 19-24  Normal Nutritional Risks: None Diabetes: No  How often do you need to have someone help you when you read instructions, pamphlets, or other written materials from your doctor or pharmacy?: 2 - Rarely  Interpreter Needed?: No  Information entered by :: Dina Warbington, RMA   Activities of Daily Living    04/03/2023   11:16 AM  In your present state of health, do you have any difficulty performing the following activities:  Hearing? 0  Vision? 0  Difficulty concentrating or making decisions? 0  Walking or climbing stairs? 0  Dressing or bathing? 0  Doing errands, shopping? 0  Preparing Food and eating ? N  Using the Toilet? N  In the past six months, have you accidently leaked urine? N  Do you have problems with loss of bowel control? N  Managing your Medications? N  Managing your Finances? N  Housekeeping or managing your Housekeeping? N    Patient Care Team: Corwin Levins, MD as PCP -  General (Internal Medicine)  Indicate any recent Medical Services you may have received from other than [facility] providers in the past year (date may be approximate).     Assessment:   This is a routine wellness examination for Douglas Miles.  Hearing/Vision screen Hearing Screening - Comments:: Denies hearing difficulties   Vision Screening - Comments:: Had cataract surgery  Dietary issues and exercise activities discussed:     Goals Addressed   None   Depression Screen    04/04/2023    8:25 AM 11/14/2022    8:08 AM 11/11/2021    8:53 AM 11/11/2021    8:19 AM 10/20/2020    8:52 AM 10/20/2020    8:32 AM 10/15/2019    8:19 AM  PHQ  2/9 Scores  PHQ - 2 Score 0 0 0 0 0 0 0  PHQ- 9 Score 0 0         Fall Risk    04/03/2023   11:16 AM 11/14/2022    8:08 AM 11/11/2021    8:53 AM 11/11/2021    8:19 AM 10/20/2020    8:52 AM  Fall Risk   Falls in the past year? 0 0 0 0 0  Number falls in past yr: 0 0 0 0   Injury with Fall? 0 0 0 0   Risk for fall due to :  No Fall Risks     Follow up Falls evaluation completed;Falls prevention discussed Falls evaluation completed       MEDICARE RISK AT HOME: Medicare Risk at Home Any stairs in or around the home?: Yes If so, are there any without handrails?: No Home free of loose throw rugs in walkways, pet beds, electrical cords, etc?: No Adequate lighting in your home to reduce risk of falls?: Yes Life alert?: No Use of a cane, walker or w/c?: No Grab bars in the bathroom?: No Shower chair or bench in shower?: No Elevated toilet seat or a handicapped toilet?: No  TIMED UP AND GO:  Was the test performed? No    Cognitive Function:        04/04/2023    8:36 AM  6CIT Screen  What Year? --    Immunizations Immunization History  Administered Date(s) Administered   Fluad Quad(high Dose 65+) 05/11/2020, 04/22/2022   Hepatitis B 09/20/1995   Influenza Whole 05/13/2019   Influenza, High Dose Seasonal PF 05/15/2015, 04/27/2018   Influenza-Unspecified  04/08/2017, 05/05/2021   PFIZER(Purple Top)SARS-COV-2 Vaccination 08/22/2019, 09/10/2019, 05/25/2020   Pneumococcal Conjugate-13 08/08/2009, 09/06/2013   Pneumococcal Polysaccharide-23 10/04/2017   Td 01/17/1995, 03/20/1995   Tdap 08/08/2009, 10/20/2020   Varicella 08/08/2009    TDAP status: Up to date  Flu Vaccine status: Due, Education has been provided regarding the importance of this vaccine. Advised may receive this vaccine at local pharmacy or Health Dept. Aware to provide a copy of the vaccination record if obtained from local pharmacy or Health Dept. Verbalized acceptance and understanding.  Pneumococcal vaccine status: Up to date  Covid-19 vaccine status: Completed vaccines  Qualifies for Shingles Vaccine? Yes   Zostavax completed Yes   Shingrix Completed?: Yes  Screening Tests Health Maintenance  Topic Date Due   Zoster Vaccines- Shingrix (1 of 2) Never done   COVID-19 Vaccine (4 - 2023-24 season) 04/08/2022   INFLUENZA VACCINE  03/09/2023   Medicare Annual Wellness (AWV)  04/03/2024   DTaP/Tdap/Td (5 - Td or Tdap) 10/21/2030   Pneumonia Vaccine 9+ Years old  Completed   Hepatitis C Screening  Completed   HPV VACCINES  Aged Out   Colonoscopy  Discontinued    Health Maintenance  Health Maintenance Due  Topic Date Due   Zoster Vaccines- Shingrix (1 of 2) Never done   COVID-19 Vaccine (4 - 2023-24 season) 04/08/2022   INFLUENZA VACCINE  03/09/2023    Colorectal cancer screening: No longer required.   Lung Cancer Screening: (Low Dose CT Chest recommended if Age 35-80 years, 20 pack-year currently smoking OR have quit w/in 15years.) does not qualify.   Lung Cancer Screening Referral: N/A  Additional Screening:  Hepatitis C Screening: does qualify; Completed 09/10/2015  Vision Screening: Recommended annual ophthalmology exams for early detection of glaucoma and other disorders of the eye. Is the patient up to date with  their annual eye exam?  Yes  Who is  the provider or what is the name of the office in which the patient attends annual eye exams? Dr. Sharyn Dross [facility]). If pt is not established with a provider, would they like to be referred to a provider to establish care? No .   Dental Screening: Recommended annual dental exams for proper oral hygiene   Community Resource Referral / Chronic Care Management: CRR required this visit?  No   CCM required this visit?  No    Plan:     I have personally reviewed and noted the following in the patient's chart:   Medical and social history Use of alcohol, tobacco or illicit drugs  Current medications and supplements including opioid prescriptions. Patient is not currently taking opioid prescriptions. Functional ability and status Nutritional status Physical activity Advanced directives List of other physicians Hospitalizations, surgeries, and ER visits in previous 12 months Vitals Screenings to include cognitive, depression, and falls Referrals and appointments  In addition, I have reviewed and discussed with patient certain preventive protocols, quality metrics, and best practice recommendations. A written personalized care plan for preventive services as well as general preventive health recommendations were provided to patient.     Douglas Miles, CMA   04/04/2023   After Visit Summary: (MyChart) Due to this being a telephonic visit, the after visit summary with patients personalized plan was offered to patient via MyChart   Nurse Notes: The 6CIT was not answered today (Patient could not answer due to language barrier).  Patient's son translated for him today.  Patient is due for Flu vaccine and his second Shingrix vaccine.  He will soon be getting both vaccines at his local pharmacy.  Patient had no other concerns to address today.

## 2023-06-14 ENCOUNTER — Encounter: Payer: Self-pay | Admitting: Internal Medicine

## 2023-08-28 DIAGNOSIS — H35373 Puckering of macula, bilateral: Secondary | ICD-10-CM | POA: Diagnosis not present

## 2023-08-28 DIAGNOSIS — H43393 Other vitreous opacities, bilateral: Secondary | ICD-10-CM | POA: Diagnosis not present

## 2023-08-28 DIAGNOSIS — Q142 Congenital malformation of optic disc: Secondary | ICD-10-CM | POA: Diagnosis not present

## 2023-08-28 DIAGNOSIS — H524 Presbyopia: Secondary | ICD-10-CM | POA: Diagnosis not present

## 2023-08-28 DIAGNOSIS — H35361 Drusen (degenerative) of macula, right eye: Secondary | ICD-10-CM | POA: Diagnosis not present

## 2023-11-08 ENCOUNTER — Other Ambulatory Visit (INDEPENDENT_AMBULATORY_CARE_PROVIDER_SITE_OTHER): Payer: PPO

## 2023-11-08 DIAGNOSIS — E559 Vitamin D deficiency, unspecified: Secondary | ICD-10-CM

## 2023-11-08 DIAGNOSIS — E7800 Pure hypercholesterolemia, unspecified: Secondary | ICD-10-CM

## 2023-11-08 DIAGNOSIS — R739 Hyperglycemia, unspecified: Secondary | ICD-10-CM | POA: Diagnosis not present

## 2023-11-08 DIAGNOSIS — E538 Deficiency of other specified B group vitamins: Secondary | ICD-10-CM | POA: Diagnosis not present

## 2023-11-08 LAB — HEPATIC FUNCTION PANEL
ALT: 23 U/L (ref 0–53)
AST: 21 U/L (ref 0–37)
Albumin: 4.6 g/dL (ref 3.5–5.2)
Alkaline Phosphatase: 39 U/L (ref 39–117)
Bilirubin, Direct: 0.1 mg/dL (ref 0.0–0.3)
Total Bilirubin: 0.5 mg/dL (ref 0.2–1.2)
Total Protein: 8 g/dL (ref 6.0–8.3)

## 2023-11-08 LAB — CBC WITH DIFFERENTIAL/PLATELET
Basophils Absolute: 0 10*3/uL (ref 0.0–0.1)
Basophils Relative: 0.4 % (ref 0.0–3.0)
Eosinophils Absolute: 0.2 10*3/uL (ref 0.0–0.7)
Eosinophils Relative: 3.1 % (ref 0.0–5.0)
HCT: 42.6 % (ref 39.0–52.0)
Hemoglobin: 14.1 g/dL (ref 13.0–17.0)
Lymphocytes Relative: 34.3 % (ref 12.0–46.0)
Lymphs Abs: 2.3 10*3/uL (ref 0.7–4.0)
MCHC: 33.1 g/dL (ref 30.0–36.0)
MCV: 92.9 fl (ref 78.0–100.0)
Monocytes Absolute: 0.5 10*3/uL (ref 0.1–1.0)
Monocytes Relative: 7.7 % (ref 3.0–12.0)
Neutro Abs: 3.6 10*3/uL (ref 1.4–7.7)
Neutrophils Relative %: 54.5 % (ref 43.0–77.0)
Platelets: 231 10*3/uL (ref 150.0–400.0)
RBC: 4.59 Mil/uL (ref 4.22–5.81)
RDW: 12.8 % (ref 11.5–15.5)
WBC: 6.7 10*3/uL (ref 4.0–10.5)

## 2023-11-08 LAB — LIPID PANEL
Cholesterol: 141 mg/dL (ref 0–200)
HDL: 49.5 mg/dL (ref >39.00)
LDL Cholesterol: 70 mg/dL (ref 0–99)
NonHDL: 91.52
Total CHOL/HDL Ratio: 3
Triglycerides: 110 mg/dL (ref 0.0–149.0)
VLDL: 22 mg/dL (ref 0.0–40.0)

## 2023-11-08 LAB — URINALYSIS, ROUTINE W REFLEX MICROSCOPIC
Bilirubin Urine: NEGATIVE
Hgb urine dipstick: NEGATIVE
Ketones, ur: NEGATIVE
Leukocytes,Ua: NEGATIVE
Nitrite: NEGATIVE
RBC / HPF: NONE SEEN (ref 0–?)
Specific Gravity, Urine: 1.01 (ref 1.000–1.030)
Total Protein, Urine: NEGATIVE
Urine Glucose: NEGATIVE
Urobilinogen, UA: 0.2 (ref 0.0–1.0)
pH: 6.5 (ref 5.0–8.0)

## 2023-11-08 LAB — BASIC METABOLIC PANEL WITH GFR
BUN: 19 mg/dL (ref 6–23)
CO2: 28 meq/L (ref 19–32)
Calcium: 9.4 mg/dL (ref 8.4–10.5)
Chloride: 100 meq/L (ref 96–112)
Creatinine, Ser: 0.92 mg/dL (ref 0.40–1.50)
GFR: 78.91 mL/min (ref >60.00)
Glucose, Bld: 94 mg/dL (ref 70–99)
Potassium: 4 meq/L (ref 3.5–5.1)
Sodium: 136 meq/L (ref 135–145)

## 2023-11-08 LAB — TSH: TSH: 1.01 u[IU]/mL (ref 0.35–5.50)

## 2023-11-08 LAB — VITAMIN D 25 HYDROXY (VIT D DEFICIENCY, FRACTURES): VITD: 44.19 ng/mL (ref 30.00–100.00)

## 2023-11-08 LAB — HEMOGLOBIN A1C: Hgb A1c MFr Bld: 5.6 % (ref 4.6–6.5)

## 2023-11-08 LAB — VITAMIN B12: Vitamin B-12: >1537 pg/mL — ABNORMAL HIGH (ref 211–911)

## 2023-11-15 ENCOUNTER — Encounter: Payer: Self-pay | Admitting: Internal Medicine

## 2023-11-15 ENCOUNTER — Ambulatory Visit (INDEPENDENT_AMBULATORY_CARE_PROVIDER_SITE_OTHER): Payer: PPO | Admitting: Internal Medicine

## 2023-11-15 ENCOUNTER — Other Ambulatory Visit: Payer: Self-pay

## 2023-11-15 VITALS — BP 128/78 | HR 80 | Temp 98.3°F | Ht 60.0 in | Wt 122.0 lb

## 2023-11-15 DIAGNOSIS — I10 Essential (primary) hypertension: Secondary | ICD-10-CM | POA: Diagnosis not present

## 2023-11-15 DIAGNOSIS — G4700 Insomnia, unspecified: Secondary | ICD-10-CM | POA: Insufficient documentation

## 2023-11-15 DIAGNOSIS — R739 Hyperglycemia, unspecified: Secondary | ICD-10-CM | POA: Diagnosis not present

## 2023-11-15 DIAGNOSIS — F5101 Primary insomnia: Secondary | ICD-10-CM | POA: Diagnosis not present

## 2023-11-15 DIAGNOSIS — E7800 Pure hypercholesterolemia, unspecified: Secondary | ICD-10-CM | POA: Diagnosis not present

## 2023-11-15 DIAGNOSIS — E538 Deficiency of other specified B group vitamins: Secondary | ICD-10-CM

## 2023-11-15 DIAGNOSIS — Z0001 Encounter for general adult medical examination with abnormal findings: Secondary | ICD-10-CM | POA: Diagnosis not present

## 2023-11-15 DIAGNOSIS — E559 Vitamin D deficiency, unspecified: Secondary | ICD-10-CM | POA: Diagnosis not present

## 2023-11-15 MED ORDER — AMLODIPINE BESYLATE 10 MG PO TABS: 10.0000 mg | ORAL_TABLET | Freq: Every day | ORAL | 3 refills | Status: AC

## 2023-11-15 MED ORDER — ATORVASTATIN CALCIUM 10 MG PO TABS: 10.0000 mg | ORAL_TABLET | ORAL | 3 refills | Status: AC

## 2023-11-15 MED ORDER — LISINOPRIL 20 MG PO TABS: 30.0000 mg | ORAL_TABLET | Freq: Every day | ORAL | 3 refills | Status: DC

## 2023-11-15 NOTE — Assessment & Plan Note (Signed)
 Lab Results  Component Value Date   VITAMINB12 >1537 (H) 11/08/2023   Stable, cont oral replacement - b12 1000 mcg qd

## 2023-11-15 NOTE — Assessment & Plan Note (Signed)
 BP Readings from Last 3 Encounters:  11/15/23 128/78  11/14/22 108/64  11/11/21 104/62   Fair control, pt to continue amlodipine 10 mg every day, but increased lisinopril 20 mg to 1.5 tabs (30 mg) every day,

## 2023-11-15 NOTE — Patient Instructions (Signed)
 Ok to increase the lisinopril to 1.5 tablet per day  Please continue all other medications as before, and refills have been done if requested.  Please have the pharmacy call with any other refills you may need.  Please continue your efforts at being more active, low cholesterol diet, and weight control.  You are otherwise up to date with prevention measures today.  Please keep your appointments with your specialists as you may have planned  Please make an Appointment to return for your 1 year visit, or sooner if needed, with Lab testing by Appointment as well, to be done about 3-5 days before at the FIRST FLOOR Lab (so this is for TWO appointments - please see the scheduling desk as you leave)

## 2023-11-15 NOTE — Assessment & Plan Note (Signed)
 Mild recent onset, after discussion options pt declines tx for now, ok for otc melatonin

## 2023-11-15 NOTE — Progress Notes (Addendum)
 Patient ID: Douglas Miles, male   DOB: 01-08-1946, 78 y.o.   MRN: [id number]         Chief Complaint:: wellness exam and htn, insomnia, hld, low b12       HPI:  Douglas Miles is a 78 y.o. male here for wellness exam; up to date                        Also Pt denies chest pain, increased sob or doe, wheezing, orthopnea, PND, increased LE swelling, palpitations, dizziness or syncope.   Pt denies polydipsia, polyuria, or new focal neuro s/s.    Pt denies fever, wt loss, night sweats, loss of appetite, or other constitutional symptoms  Has some difficulty more recently with getting to sleep but Denies worsening depressive symptoms, suicidal ideation, or panic;  BP's has been mild high in the sbp 130s for the most part at home in last 2 wks.  Overall good med compliance   Wt Readings from Last 3 Encounters:  11/15/23 122 lb (55.3 kg)  04/04/23 113 lb (51.3 kg)  11/14/22 122 lb (55.3 kg)   BP Readings from Last 3 Encounters:  11/15/23 128/78  11/14/22 108/64  11/11/21 104/62   Immunization History  Administered Date(s) Administered   Fluad Quad(high Dose 65+) 05/11/2020, 04/22/2022   Hepatitis B 09/20/1995   Influenza Whole 05/13/2019   Influenza, High Dose Seasonal PF 05/15/2015, 04/27/2018   Influenza-Unspecified 04/08/2017, 05/05/2021, 05/08/2023   PFIZER(Purple Top)SARS-COV-2 Vaccination 08/22/2019, 09/10/2019, 05/25/2020   Pneumococcal Conjugate-13 08/08/2009, 09/06/2013   Pneumococcal Polysaccharide-23 10/04/2017   Td 01/17/1995, 03/20/1995   Tdap 08/08/2009, 10/20/2020   Varicella 08/08/2009   Zoster Recombinant(Shingrix) 02/27/2023, 05/29/2023   There are no preventive care reminders to display for this patient.     Past Medical History:  Diagnosis Date   Environmental allergies    Hyperlipidemia    Hypertension    Past Surgical History:  Procedure Laterality Date   APPENDECTOMY  08/08/1998   EYE SURGERY Bilateral 12/21/2022   Cataract surgery    reports that he has quit  smoking. He has never used smokeless tobacco. He reports that he does not currently use alcohol. He reports that he does not use drugs. family history includes Hyperlipidemia in his son; Hypertension in an other family member. No Known Allergies Current Outpatient Medications on File Prior to Visit  Medication Sig Dispense Refill   Fiber CHEW Chew by mouth daily.     Multiple Vitamin (MULTIVITAMIN) tablet Take 1 tablet by mouth daily.     No current facility-administered medications on file prior to visit.        ROS:  All others reviewed and negative.  Objective        PE:  BP 128/78 (BP Location: Right Arm, Patient Position: Sitting, Cuff Size: Normal)   Pulse 80   Temp 98.3 F (36.8 C) (Oral)   Ht 5' (1.524 m)   Wt 122 lb (55.3 kg)   SpO2 97%   BMI 23.83 kg/m                 Constitutional: Pt appears in NAD               HENT: Head: NCAT.                Right Ear: External ear normal.                 Left Ear: External ear normal.  Eyes: . Pupils are equal, round, and reactive to light. Conjunctivae and EOM are normal               Nose: without d/c or deformity               Neck: Neck supple. Gross normal ROM               Cardiovascular: Normal rate and regular rhythm.                 Pulmonary/Chest: Effort normal and breath sounds without rales or wheezing.                Abd:  Soft, NT, ND, + BS, no organomegaly               Neurological: Pt is alert. At baseline orientation, motor grossly intact               Skin: Skin is warm. No rashes, no other new lesions, LE edema - none               Psychiatric: Pt behavior is normal without agitation   Micro: none  Cardiac tracings I have personally interpreted today:  none  Pertinent Radiological findings (summarize): none   Lab Results  Component Value Date   WBC 6.7 11/08/2023   HGB 14.1 11/08/2023   HCT 42.6 11/08/2023   PLT 231.0 11/08/2023   GLUCOSE 94 11/08/2023   CHOL 141 11/08/2023   TRIG  110.0 11/08/2023   HDL 49.50 11/08/2023   LDLDIRECT 122.4 08/26/2011   LDLCALC 70 11/08/2023   ALT 23 11/08/2023   AST 21 11/08/2023   NA 136 11/08/2023   K 4.0 11/08/2023   CL 100 11/08/2023   CREATININE 0.92 11/08/2023   BUN 19 11/08/2023   CO2 28 11/08/2023   TSH 1.01 11/08/2023   PSA 1.10 11/07/2022   INR 1.00 05/06/2014   HGBA1C 5.6 11/08/2023   Assessment/Plan:  Douglas Miles is a 78 y.o. Asian [4] male with  has a past medical history of Environmental allergies, Hyperlipidemia, and Hypertension.  Encounter for well adult exam with abnormal findings Age and sex appropriate education and counseling updated with regular exercise and diet Referrals for preventative services - none needed Immunizations addressed - none needed Smoking counseling  - none needed Evidence for depression or other mood disorder - none significant Most recent labs reviewed. I have personally reviewed and have noted: 1) the patient's medical and social history 2) The patient's current medications and supplements 3) The patient's height, weight, and BMI have been recorded in the chart   Hypertension BP Readings from Last 3 Encounters:  11/15/23 128/78  11/14/22 108/64  11/11/21 104/62   Fair control, pt to continue amlodipine 10 mg every day, but increased lisinopril 20 mg to 1.5 tabs (30 mg) every day,    Hyperlipidemia Lab Results  Component Value Date   LDLCALC 70 11/08/2023   Stable, pt to continue current statin lipitor 10 mg qd   Hyperglycemia Lab Results  Component Value Date   HGBA1C 5.6 11/08/2023   Stable, pt to continue current medical treatment  - diet, wt control   B12 deficiency Lab Results  Component Value Date   VITAMINB12 >1537 (H) 11/08/2023   Stable, cont oral replacement - b12 1000 mcg qd   Insomnia Mild recent onset, after discussion options pt declines tx for now, ok for otc melatonin Followup: Return in about 1 year (around 11/14/2024).  Oliver Barre, MD  11/15/2023 9:19 AM [facility] Internal Medicine

## 2023-11-15 NOTE — Assessment & Plan Note (Signed)
 Lab Results  Component Value Date   HGBA1C 5.6 11/08/2023   Stable, pt to continue current medical treatment  - diet, wt control

## 2023-11-15 NOTE — Assessment & Plan Note (Signed)

## 2023-11-15 NOTE — Addendum Note (Signed)
 Addended by: Corwin Levins on: 11/15/2023 09:20 AM   Modules accepted: Level of Service

## 2023-11-15 NOTE — Assessment & Plan Note (Signed)
 Lab Results  Component Value Date   LDLCALC 70 11/08/2023   Stable, pt to continue current statin lipitor 10 mg qd

## 2023-12-27 ENCOUNTER — Other Ambulatory Visit: Payer: Self-pay

## 2023-12-27 ENCOUNTER — Other Ambulatory Visit: Payer: Self-pay | Admitting: Internal Medicine

## 2023-12-28 ENCOUNTER — Telehealth: Payer: Self-pay

## 2023-12-28 ENCOUNTER — Ambulatory Visit: Payer: Self-pay

## 2023-12-28 NOTE — Telephone Encounter (Signed)
 Copied from CRM [id number]. Topic: General - Other >> Dec 28, 2023  9:35 AM Dorisann Garre T wrote: Reason for CRM: kim from [facility] pharmacy is calling in regarding a patient medication for lisinopril  (ZESTRIL ) 20 MG tablet  she is needing to know how the patient is needing to take the medication she is needing a call back at  [phone]

## 2023-12-28 NOTE — Telephone Encounter (Signed)
 This RN made first attempt to triage patient, with interpreter on the line. No answer, LVM. Routing for additional attempts.   Copied from CRM [id number]. Topic: Clinical - Prescription Issue >> Dec 28, 2023  3:19 PM Stanly Early wrote: Reason for CRM: Douglas Miles is calling to ask question about his father medication, there is some confusing on his part. He stated the medication was put in wrong. [phone]

## 2023-12-28 NOTE — Telephone Encounter (Signed)
 Patient's son calling to request order change for medication due to increased dose on 11/15/23. Son on [place]  Chief Complaint: medication problem for order lisinopril . Pharmacy will not dispense medication refill due to order needs to be updated to take 1. 5 tablets daily instead of 1 tablet  Symptoms: na Frequency: today  Pertinent Negatives: Patient denies na Disposition: [] ED /[] Urgent Care (no appt availability in office) / [] Appointment(In office/virtual)/ []  [place] [facility]/ [] Home Care/ [] Refused Recommended Disposition /[] [facility] [place] [facility]/ [x]  Follow-up with PCP Additional Notes:   Please update recent order for lisinopril  for patient to take 1.5 tablets instead of 1 tablet. See last OV notes from PCP on 11/15/23: Fair control, pt to continue amlodipine  10 mg every day, but increased lisinopril  20 mg to 1.5 tabs (30 mg) every day  Patient almost out of previous medication.     Reason for Disposition  [1] Prescription not at pharmacy AND [2] was prescribed by PCP recently (Exception: [facility] has access to EMR and prescription is recorded there. Go to Home Care and confirm for pharmacy.)  Answer Assessment - Initial Assessment Questions 1. NAME of MEDICINE: "What medicine(s) are you calling about?"     Lisinopril   2. QUESTION: "What is your question?" (e.g., double dose of medicine, side effect)     Pharmacy will not refill prescription. Patient son called to request new order due to last OV 11/15/23 was told to increase medication to take 1. 5 tablets daily instead of 1 tablet 20 mg.  3. PRESCRIBER: "Who prescribed the medicine?" Reason: if prescribed by specialist, call should be referred to that group.     PCP 4. SYMPTOMS: "Do you have any symptoms?" If Yes, ask: "What symptoms are you having?"  "How bad are the symptoms (e.g., mild, moderate, severe)      Na  5. PREGNANCY:  "Is there any chance that you are pregnant?" "When was your last menstrual period?"      na  Protocols used: Medication Question Call-A-AH

## 2023-12-29 ENCOUNTER — Other Ambulatory Visit: Payer: Self-pay | Admitting: Internal Medicine

## 2023-12-29 ENCOUNTER — Other Ambulatory Visit: Payer: Self-pay

## 2023-12-29 MED ORDER — LISINOPRIL 20 MG PO TABS: 30.0000 mg | ORAL_TABLET | Freq: Every day | ORAL | 3 refills | Status: AC

## 2023-12-29 NOTE — Telephone Encounter (Signed)
 Dr.John sent in a updated script today.

## 2023-12-29 NOTE — Telephone Encounter (Signed)
 Ok I can send updated script erx

## 2023-12-29 NOTE — Telephone Encounter (Signed)
 Copied from CRM [id number]. Topic: Clinical - Prescription Issue >> Dec 28, 2023  3:19 PM Stanly Early wrote: Reason for CRM: Clyde Darling is calling to ask question about his father medication, there is some confusing on his part. He stated the medication was put in wrong. [phone] >> Dec 29, 2023  2:36 PM Magdalene School wrote: Patient called stating that he is waiting on lisinopril  (ZESTRIL ) 20 MG tablet to be changed to 30mg  and stated that the pharmacy sent a request to be approved. He would like to pickup medication today 12/29/23 so he does not have to go the whole weekend without his meds.

## 2024-07-10 ENCOUNTER — Telehealth: Payer: Self-pay

## 2024-07-10 NOTE — Telephone Encounter (Signed)
 Copied from CRM #[id number]. Topic: Appointments - Scheduling Inquiry for Clinic >> Jul 10, 2024 11:18 AM Vena HERO wrote: Reason for CRM: Pt son called in today to schedule an appt for [facility] due to Dr Norleen retiring soon. He wanted to schedule with Almarie [facility] because he seen online she is accepting new pts however our system says she is not. He is wondering if she would make an exception for his father and schedule an appt in 11/2024. Please call Dawaun Brancato at [phone] to advise, he would like to get his whole family (father, mother and sister) under Dr Rogena care because she is an MD but I did explain to him that may not be possible.

## 2024-07-12 NOTE — Telephone Encounter (Signed)
 Pt has been notified.

## 2024-07-12 NOTE — Telephone Encounter (Signed)
Not taking new patients sorry.  

## 2024-11-18 ENCOUNTER — Encounter: Admitting: Internal Medicine
